# Patient Record
Sex: Female | Born: 1985 | Hispanic: Yes | Marital: Single | Smoking: Current every day smoker
Health system: Southern US, Community
[De-identification: ages and names within clinical notes are randomized; demographics above are authoritative.]

## PROBLEM LIST (undated history)

## (undated) DIAGNOSIS — F419 Anxiety disorder, unspecified: Secondary | ICD-10-CM

## (undated) DIAGNOSIS — O139 Gestational [pregnancy-induced] hypertension without significant proteinuria, unspecified trimester: Secondary | ICD-10-CM

## (undated) DIAGNOSIS — F329 Major depressive disorder, single episode, unspecified: Secondary | ICD-10-CM

## (undated) DIAGNOSIS — F32A Depression, unspecified: Secondary | ICD-10-CM

## (undated) HISTORY — PX: NO PAST SURGERIES: SHX2092

## (undated) HISTORY — DX: Gestational (pregnancy-induced) hypertension without significant proteinuria, unspecified trimester: O13.9

---
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
## 2011-04-30 DIAGNOSIS — O1490 Unspecified pre-eclampsia, unspecified trimester: Secondary | ICD-10-CM

## 2018-05-03 NOTE — L&D Delivery Note (Signed)
Patient: Julia Torres MRN: [id number]  GBS status: Unknown, IAP given: None due to term status   Patient is a 33 y.o. now G3P2 s/p NSVD at [redacted]w[redacted]d, who was admitted for IOL for gHTN. AROM 3h 62m prior to delivery with clear and bloody fluid.    Delivery Note At 4:52 AM a viable female was delivered via Vaginal, Spontaneous (Presentation: OA).  APGAR: 8, 9; weight pending.   Placenta status: spontaneous, intact.  Cord: 3 vessel with the following complications: short cord.   Anesthesia:  Epidural  Episiotomy: None Lacerations: 1st degree Suture Repair: 3.0 monocryl Est. Blood Loss (mL): 152  Head delivered OA. No nuchal cord present. Compound hand noted. Shoulder and body delivered in usual fashion. Infant with spontaneous cry, placed on mother's abdomen, dried and bulb suctioned. Cord clamped x 2 after 1-minute delay, and cut by family member. Cord blood drawn. Placenta delivered spontaneously with gentle cord traction. Fundus firm with massage and Pitocin. Perineum inspected and found to have small first degree laceration. Pressure was held but continued to bleed so was repaired with 3.0 monocryl with good hemostasis achieved.  Mom to postpartum.  Baby to Couplet care / Skin to Skin.  Melina Schools 12/10/2018, 5:15 AM

## 2018-06-19 ENCOUNTER — Encounter: Payer: Self-pay | Admitting: General Practice

## 2018-06-19 ENCOUNTER — Ambulatory Visit (INDEPENDENT_AMBULATORY_CARE_PROVIDER_SITE_OTHER): Payer: Medicaid Other | Admitting: *Deleted

## 2018-06-19 ENCOUNTER — Other Ambulatory Visit: Payer: Self-pay

## 2018-06-19 VITALS — BP 127/84 | HR 76 | Temp 98.1°F | Ht 69.0 in | Wt 224.6 lb

## 2018-06-19 DIAGNOSIS — Z3481 Encounter for supervision of other normal pregnancy, first trimester: Secondary | ICD-10-CM | POA: Diagnosis not present

## 2018-06-19 DIAGNOSIS — Z3480 Encounter for supervision of other normal pregnancy, unspecified trimester: Secondary | ICD-10-CM | POA: Diagnosis not present

## 2018-06-19 DIAGNOSIS — O09299 Supervision of pregnancy with other poor reproductive or obstetric history, unspecified trimester: Secondary | ICD-10-CM | POA: Diagnosis not present

## 2018-06-19 DIAGNOSIS — Z3201 Encounter for pregnancy test, result positive: Secondary | ICD-10-CM

## 2018-06-19 DIAGNOSIS — Z13228 Encounter for screening for other metabolic disorders: Secondary | ICD-10-CM | POA: Diagnosis not present

## 2018-06-19 DIAGNOSIS — Z3200 Encounter for pregnancy test, result unknown: Secondary | ICD-10-CM

## 2018-06-19 LAB — POCT URINALYSIS DIPSTICK OB
Blood, UA: NEGATIVE
Glucose, UA: NEGATIVE
Leukocytes, UA: NEGATIVE
Nitrite, UA: NEGATIVE
Spec Grav, UA: >=1.03 — AB (ref 1.010–1.025)
Urobilinogen, UA: 1 E.U./dL
pH, UA: 7.5 (ref 5.0–8.0)

## 2018-06-19 LAB — POCT URINE PREGNANCY: Preg Test, Ur: POSITIVE — AB

## 2018-06-19 NOTE — Progress Notes (Addendum)
   PRENATAL INTAKE SUMMARY  Ms. Collaso presents today New OB Nurse Interview.  OB History    Gravida  2   Para  1   Term  1   Preterm      AB      Living  1     SAB      TAB      Ectopic      Multiple      Live Births  1          I have reviewed the patient's medical, obstetrical, social, and family histories, medications, and available lab results.  SUBJECTIVE She has no unusual complaints. Marijuana use in pregnancy. Last use was three days ago.  History of preeclampsia during last pregnancy.  OBJECTIVE Initial nurse interview for history and lab work (New OB).  EDD: 12/23/2018 by LMP [id number] G2P1001 FHT: 155  GENERAL APPEARANCE: alert, well appearing, in no apparent distress, oriented to person, place and time.   ASSESSMENT Normal pregnancy  PLAN Prenatal care-[facility] Pnl/HIV  OB Urine Culture/Dip GC/CT/PAP at next visit with midwife HgbEval SMA/CF ([facility]) Panorama A1C If CHTN - P/C Ratio and CMP Ultrasound <14 weeks ordered Patient to start PNV Info on marijuana use during pregnancy and warning signs during pregnancy given  Genetic Screening Results Information: You are having genetic testing called [facility]ama today.  It will take approximately 2 weeks before the results are available.  To get your results, you need Internet access to a web browser to search [facility]/MyChart (the direct app on your phone will not give you these results).  Then select Lab Scanned and click on the blue hyper link that says View Image to see your Panorama results.  You can also use the directions on the purple card given to look up your results directly on the [facility] website.  Clovis Pu, RN

## 2018-06-19 NOTE — Patient Instructions (Addendum)
Genetic Screening Results Information: You are having genetic testing called Panorama today.  It will take approximately 2 weeks before the results are available.  To get your results, you need Internet access to a web browser to search [facility]/MyChart (the direct app on your phone will not give you these results).  Then select Lab Scanned and click on the blue hyper link that says View Image to see your Panorama results.  You can also use the directions on the purple card given to look up your results directly on the [facility] website.   Warning Signs During Pregnancy A pregnancy lasts about 40 weeks, starting from the first day of your last period until the baby is born. Pregnancy is divided into three phases called trimesters.  The first trimester refers to week 1 through week 13 of pregnancy.  The second trimester is the start of week 14 through the end of week 27.  The third trimester is the start of week 28 until you deliver your baby. During each trimester of pregnancy, certain signs and symptoms may indicate a problem. Talk with your health care provider about your current health and any medical conditions you have. Make sure you know the symptoms that you should watch for and report. How does this affect me?  Warning signs in the first trimester While some changes during the first trimester may be uncomfortable, most do not represent a serious problem. Let your health care provider know if you have any of the following warning signs in the first trimester:  You cannot eat or drink without vomiting, and this lasts for longer than a day.  You have vaginal bleeding or spotting along with menstrual-like cramping.  You have diarrhea for longer than a day.  You have a fever or other signs of infection, such as: ? Pain or burning when you urinate. ? Foul smelling or thick or yellowish vaginal discharge. Warning signs in the second trimester As your baby grows and changes during the  second trimester, there are additional signs and symptoms that may indicate a problem. These include:  Signs and symptoms of infection, including a fever.  Signs or symptoms of a miscarriage or preterm labor, such as regular contractions, menstrual-like cramping, or lower abdominal pain.  Bloody or watery vaginal discharge or obvious vaginal bleeding.  Feeling like your heart is pounding.  Having trouble breathing.  Nausea, vomiting, or diarrhea that lasts for longer than a day.  Craving non-food items, such as clay, chalk, or dirt. This may be a sign of a very treatable medical condition called pica. Later in your second trimester, watch for signs and symptoms of a serious medical condition called preeclampsia.These include:  Changes in your vision.  A severe headache that does not go away.  Nausea and vomiting. It is also important to notice if your baby stops moving or moves less than usual during this time. Warning signs in the third trimester As you approach the third trimester, your baby is growing and your body is preparing for the birth of your baby. In your third trimester, be sure to let your health care provider know if:  You have signs and symptoms of infection, including a fever.  You have vaginal bleeding.  You notice that your baby is moving less than usual or is not moving.  You have nausea, vomiting, or diarrhea that lasts for longer than a day.  You have a severe headache that does not go away.  You have vision changes,  including seeing spots or having blurry or double vision.  You have increased swelling in your hands or face. How does this affect my baby? Throughout your pregnancy, always report any of the warning signs of a problem to your health care provider. This can help prevent complications that may affect your baby, including:  Increased risk for premature birth.  Infection that may be transmitted to your baby.  Increased risk for  stillbirth. Contact a health care provider if:  You have any of the warning signs of a problem for the current trimester of your pregnancy.  Any of the following apply to you during any trimester of pregnancy: ? You have strong emotions, such as sadness or anxiety, that interfere with work or personal relationships. ? You feel unsafe in your home and need help finding a safe place to live. ? You are using tobacco products, alcohol, or drugs and you need help to stop. Get help right away if: You have signs or symptoms of labor before 37 weeks of pregnancy. These include:  Contractions that are 5 minutes or less apart, or that increase in frequency, intensity, or length.  Sudden, sharp abdominal pain or low back pain.  Uncontrolled gush or trickle of fluid from your vagina. Summary  A pregnancy lasts about 40 weeks, starting from the first day of your last period until the baby is born. Pregnancy is divided into three phases called trimesters. Each trimester has warning signs to watch for.  Always report any warning signs to your health care provider in order to prevent complications that may affect both you and your baby.  Talk with your health care provider about your current health and any medical conditions you have. Make sure you know the symptoms that you should watch for and report. This information is not intended to replace advice given to you by your health care provider. Make sure you discuss any questions you have with your health care provider. Document Released: 02/03/2017 Document Revised: 02/03/2017 Document Reviewed: 02/03/2017 Elsevier Interactive Patient Education  2019 [employer].      Marijuana Use During Pregnancy and Breastfeeding  Marijuana is the dried leaves, flowers, and stems of the Cannabis sativa or Cannabis indica plant. The plant's active ingredients (cannabinoids), including a chemical called THC, change the chemistry of the brain. Marijuana smoke  also has many of the same chemicals as cigarette smoke that cause breathing problems. Marijuana gets into your blood through your lungs when you smoke it and through your digestive system when you swallow it. Using marijuana in any form may be harmful for you and your baby when you are trying to become pregnant and during pregnancy. This includes marijuana that is prescribed to you by a health care provider (medical marijuana). Once marijuana is in your blood, it can travel through your placenta to your baby. It may also pass through breast milk. How does this affect me? Marijuana affects you both mentally and physically. Using marijuana can make you feel high and relaxed. It can also have negative effects, especially at high doses or with long-term use. These include:  Rapid heartbeat and stress on your heart.  Lung irritation and breathing problems.  Difficulty thinking and making decisions.  Seeing or believing things that are not true (hallucinations and paranoia).  Mood swings, depression, or anxiety.  Decreased ability to learn and remember.  Difficulty getting pregnant. Marijuana can also affect your pregnancy. Not all the effects are known. However, if you use marijuana during pregnancy,  you may:  Be less likely to get regular prenatal care and do the things that you need to do to have a healthy pregnancy.  Be more likely to use other drugs that can harm your pregnancy, like drinking alcohol and smoking cigarettes.  Be at higher risk of having your baby die after 28 weeks of pregnancy (stillbirth).  Be at higher risk of giving birth before 37 weeks of pregnancy (premature birth). How does this affect my baby? If you use marijuana during pregnancy, this may affect your baby's development, birth, and life after birth. Your baby may:  Be born prematurely, which can cause physical and mental problems.  Be born with a low birth weight, which can lead to physical and mental  problems.  Have problems with brain development.  Have difficulty growing.  Have attention and behavior problems later in life.  Do poorly at school and have learning problems later in life.  Have problems with vision and coordination.  Be at higher risk for using marijuana by age 54. More research is needed to find out exactly how marijuana affects a baby during breastfeeding. Some studies suggest that the chemicals in marijuana can be passed to a baby through breast milk. To limit possible risks, you should not use marijuana during breastfeeding. Follow these instructions at home:  Let your health care provider know if you use marijuana before trying to get pregnant, during pregnancy, or during breastfeeding.  Do not use marijuana in any form when you are trying to get pregnant, when you are pregnant, or when you are breastfeeding. If you are having trouble stopping marijuana use, ask your health care provider for help.  Do not smoke. If you need help quitting, ask your health care provider for help.  If you are using medical marijuana, ask your health care provider to switch you to a medicine that is safer to use during pregnancy or breastfeeding.  Keep all your prenatal visits as told by your health care provider. This is important. Where to find more information [employer] on Drug Abuse: www.drugabuse.gov March of Dimes: www.marchofdimes.org/pregnancy Contact a health care provider if:  You use marijuana and want to get pregnant.  You use marijuana during pregnancy or breastfeeding.  You need help stopping marijuana use. Get help right away if:  Your baby is not gaining weight or growing as expected. Summary  Using marijuana in any form may be harmful for you and your baby when you are trying to become pregnant, during pregnancy, and during breastfeeding. This includes marijuana that is prescribed to you (medical marijuana).  Some studies suggest that marijuana  may pass through breast milk and can affect your baby's brain development.  Talk to your health care provider if you use marijuana in any form while trying to get pregnant, during pregnancy, or while breastfeeding.  Ask your health care provider for help if you are not able to stop using marijuana. This information is not intended to replace advice given to you by your health care provider. Make sure you discuss any questions you have with your health care provider. Document Released: 01/05/2017 Document Revised: 01/05/2017 Document Reviewed: 01/05/2017 Elsevier Interactive Patient Education  2019 [employer].

## 2018-06-20 LAB — PROTEIN / CREATININE RATIO, URINE
Creatinine, Urine: 16.5 mg/dL
Protein, Ur: 4.8 mg/dL
Protein/Creat Ratio: 291 mg/g creat — ABNORMAL HIGH (ref 0–200)

## 2018-06-20 LAB — COMPREHENSIVE METABOLIC PANEL
ALT: 11 IU/L (ref 0–32)
AST: 8 IU/L (ref 0–40)
Albumin/Globulin Ratio: 1.5 (ref 1.2–2.2)
Albumin: 3.7 g/dL — ABNORMAL LOW (ref 3.9–5.0)
Alkaline Phosphatase: 67 IU/L (ref 39–117)
BUN/Creatinine Ratio: 11 (ref 9–23)
BUN: 6 mg/dL (ref 6–20)
Bilirubin Total: <0.2 mg/dL (ref 0.0–1.2)
CO2: 20 mmol/L (ref 20–29)
Calcium: 8.9 mg/dL (ref 8.7–10.2)
Chloride: 102 mmol/L (ref 96–106)
Creatinine, Ser: 0.54 mg/dL — ABNORMAL LOW (ref 0.57–1.00)
GFR calc Af Amer: 146 mL/min/{1.73_m2} (ref >59)
GFR calc non Af Amer: 127 mL/min/{1.73_m2} (ref >59)
Globulin, Total: 2.5 g/dL (ref 1.5–4.5)
Glucose: 124 mg/dL — ABNORMAL HIGH (ref 65–99)
Potassium: 3.9 mmol/L (ref 3.5–5.2)
Sodium: 137 mmol/L (ref 134–144)
Total Protein: 6.2 g/dL (ref 6.0–8.5)

## 2018-06-20 LAB — HEMOGLOBIN A1C
Est. average glucose Bld gHb Est-mCnc: 114 mg/dL
Hgb A1c MFr Bld: 5.6 % (ref 4.8–5.6)

## 2018-06-21 LAB — OBSTETRIC PANEL, INCLUDING HIV
Antibody Screen: NEGATIVE
Basophils Absolute: 0 10*3/uL (ref 0.0–0.2)
Basos: 0 %
EOS (ABSOLUTE): 0.1 10*3/uL (ref 0.0–0.4)
Eos: 1 %
HIV Screen 4th Generation wRfx: NONREACTIVE
Hematocrit: 39.9 % (ref 34.0–46.6)
Hemoglobin: 13.3 g/dL (ref 11.1–15.9)
Hepatitis B Surface Ag: NEGATIVE
IMMATURE GRANULOCYTES: 0 %
Immature Grans (Abs): 0 10*3/uL (ref 0.0–0.1)
Lymphocytes Absolute: 2 10*3/uL (ref 0.7–3.1)
Lymphs: 19 %
MCH: 28.7 pg (ref 26.6–33.0)
MCHC: 33.3 g/dL (ref 31.5–35.7)
MCV: 86 fL (ref 79–97)
Monocytes Absolute: 0.6 10*3/uL (ref 0.1–0.9)
Monocytes: 6 %
Neutrophils Absolute: 7.8 10*3/uL — ABNORMAL HIGH (ref 1.4–7.0)
Neutrophils: 74 %
Platelets: 306 10*3/uL (ref 150–450)
RBC: 4.63 x10E6/uL (ref 3.77–5.28)
RDW: 13.3 % (ref 11.7–15.4)
RPR: NONREACTIVE
Rh Factor: POSITIVE
Rubella Antibodies, IGG: <0.9 index — ABNORMAL LOW (ref >0.99)
WBC: 10.5 10*3/uL (ref 3.4–10.8)

## 2018-06-21 LAB — HEMOGLOBINOPATHY EVALUATION
Ferritin: 35 ng/mL (ref 15–150)
HGB F QUANT: 0 % (ref 0.0–2.0)
Hgb A2 Quant: 2 % (ref 1.8–3.2)
Hgb A: 98 % (ref 96.4–98.8)
Hgb C: 0 %
Hgb S: 0 %
Hgb Solubility: NEGATIVE
Hgb Variant: 0 %

## 2018-06-21 LAB — CULTURE, OB URINE

## 2018-06-21 LAB — URINE CULTURE, OB REFLEX: ORGANISM ID, BACTERIA: NO GROWTH

## 2018-06-22 ENCOUNTER — Telehealth: Payer: Self-pay | Admitting: *Deleted

## 2018-06-22 DIAGNOSIS — O09299 Supervision of pregnancy with other poor reproductive or obstetric history, unspecified trimester: Secondary | ICD-10-CM

## 2018-06-22 MED ORDER — ASPIRIN 81 MG PO TABS: 81.0000 mg | ORAL_TABLET | Freq: Every day | ORAL | 6 refills | Status: DC

## 2018-06-22 NOTE — Telephone Encounter (Signed)
Spoke with patient regarding preeclampsia labs. Labs were abnormal. Patient to start Aspirin 81 mg daily until [redacted] weeks gestation.  Medication sent to pharmacy.  Clovis Pu, RN

## 2018-06-22 NOTE — Telephone Encounter (Signed)
-----   Message from [place], [place] sent at 06/21/2018  3:24 PM EST ----- This patient needs to be on baby asprin 81 mg daily until 36 wks

## 2018-06-26 ENCOUNTER — Encounter: Payer: Self-pay | Admitting: General Practice

## 2018-06-28 ENCOUNTER — Ambulatory Visit (HOSPITAL_COMMUNITY)
Admission: RE | Admit: 2018-06-28 | Discharge: 2018-06-28 | Disposition: A | Discharge status: Home / Self Care | Payer: Medicaid Other | Source: Ambulatory Visit | Attending: Obstetrics and Gynecology | Admitting: Obstetrics and Gynecology

## 2018-06-28 ENCOUNTER — Encounter: Payer: Self-pay | Admitting: General Practice

## 2018-06-28 ENCOUNTER — Other Ambulatory Visit: Payer: Self-pay | Admitting: Obstetrics and Gynecology

## 2018-06-28 DIAGNOSIS — Z3480 Encounter for supervision of other normal pregnancy, unspecified trimester: Secondary | ICD-10-CM | POA: Insufficient documentation

## 2018-06-28 DIAGNOSIS — Z3A13 13 weeks gestation of pregnancy: Secondary | ICD-10-CM | POA: Diagnosis not present

## 2018-06-28 DIAGNOSIS — O26841 Uterine size-date discrepancy, first trimester: Secondary | ICD-10-CM | POA: Diagnosis not present

## 2018-07-05 ENCOUNTER — Encounter: Payer: Medicaid Other | Admitting: Obstetrics and Gynecology

## 2018-07-17 ENCOUNTER — Telehealth: Payer: Self-pay | Admitting: *Deleted

## 2018-07-17 NOTE — Telephone Encounter (Signed)
Patient informed that as part of COVID-19 precautions our office are only allowing patients to bring 1 support person to their visit.  Clovis Pu, RN

## 2018-07-19 ENCOUNTER — Other Ambulatory Visit: Payer: Self-pay | Admitting: Obstetrics and Gynecology

## 2018-07-19 ENCOUNTER — Other Ambulatory Visit (HOSPITAL_COMMUNITY)
Admission: RE | Admit: 2018-07-19 | Discharge: 2018-07-19 | Disposition: A | Discharge status: Home / Self Care | Payer: Medicaid Other | Source: Ambulatory Visit | Attending: Obstetrics and Gynecology | Admitting: Obstetrics and Gynecology

## 2018-07-19 ENCOUNTER — Encounter: Payer: Self-pay | Admitting: Obstetrics and Gynecology

## 2018-07-19 ENCOUNTER — Telehealth: Payer: Self-pay | Admitting: General Practice

## 2018-07-19 ENCOUNTER — Encounter: Payer: Self-pay | Admitting: General Practice

## 2018-07-19 ENCOUNTER — Telehealth: Payer: Self-pay | Admitting: *Deleted

## 2018-07-19 ENCOUNTER — Ambulatory Visit (INDEPENDENT_AMBULATORY_CARE_PROVIDER_SITE_OTHER): Payer: Medicaid Other | Admitting: Obstetrics and Gynecology

## 2018-07-19 ENCOUNTER — Other Ambulatory Visit: Payer: Self-pay

## 2018-07-19 VITALS — BP 135/83 | HR 79 | Temp 98.6°F | Wt 231.0 lb

## 2018-07-19 DIAGNOSIS — Z3A17 17 weeks gestation of pregnancy: Secondary | ICD-10-CM

## 2018-07-19 DIAGNOSIS — Z3480 Encounter for supervision of other normal pregnancy, unspecified trimester: Secondary | ICD-10-CM

## 2018-07-19 DIAGNOSIS — O09299 Supervision of pregnancy with other poor reproductive or obstetric history, unspecified trimester: Secondary | ICD-10-CM

## 2018-07-19 DIAGNOSIS — O285 Abnormal chromosomal and genetic finding on antenatal screening of mother: Secondary | ICD-10-CM

## 2018-07-19 NOTE — Progress Notes (Signed)
  Subjective:    Julia Torres is being seen today for her first obstetrical visit.  This is a planned pregnancy. She is at [redacted]w[redacted]d gestation. Her obstetrical history is significant for history of pre-eclampsia and smoker. Relationship with FOB: significant other, not living together. Patient does intend to breast feed. Pregnancy history fully reviewed.  Patient reports occasional vomiting x 2 days, but then resolves.  Review of Systems:   Review of Systems  Constitutional: Negative.   HENT: Negative.   Eyes: Negative.   Respiratory: Negative.   Cardiovascular: Negative.   Gastrointestinal: Negative.   Endocrine: Negative.   Genitourinary: Negative.   Musculoskeletal: Negative.   Skin: Negative.   Allergic/Immunologic: Negative.   Neurological: Negative.   Hematological: Negative.   Psychiatric/Behavioral: Negative.     Objective:     BP 135/83   Pulse 79   Temp 98.6 F (37 C)   Wt 231 lb (104.8 kg)   LMP 03/18/2018 (Approximate)   BMI 34.11 kg/m  Physical Exam  Nursing note and vitals reviewed. Constitutional: She is oriented to person, place, and time. She appears well-developed and well-nourished.  HENT:  Head: Normocephalic and atraumatic.  Eyes: Pupils are equal, round, and reactive to light. Left eye discharge:    Neck: Normal range of motion. Neck supple.  Cardiovascular: Normal rate, normal heart sounds and intact distal pulses.  Respiratory: Effort normal and breath sounds normal.  GI: Soft. Bowel sounds are normal.  Genitourinary:    Vulva normal.     Genitourinary Comments: Uterus: GRAVID, S=D, SE: cervix is smooth, pink, no lesions, small amt of thin, white vaginal d/c -- PAP, WP, GC/CT done, closed/long/firm, no CMT or friability, no adnexal tenderness    Musculoskeletal: Normal range of motion.  Neurological: She is alert and oriented to person, place, and time. She has normal reflexes.  Skin: Skin is warm and dry.  Psychiatric: She has a normal mood  and affect. Her behavior is normal. Judgment and thought content normal.    Maternal Exam:  Abdomen: Patient reports no abdominal tenderness. Fundal height is 17 cm.    Introitus: Normal vulva. Normal vagina.  Ferning test: not done.  Nitrazine test: not done. Amniotic fluid character: not assessed.  Pelvis: adequate for delivery.   Cervix: Cervix evaluated by sterile speculum exam and digital exam.     Fetal Exam Fetal Monitor Review: Baseline rate: 153 bpm.         Assessment:    Pregnancy: G2P1001 Patient Active Problem List   Diagnosis Date Noted  . Supervision of other normal pregnancy, antepartum 06/19/2018  . Hx of preeclampsia, prior pregnancy, currently pregnant 06/19/2018       Plan:     Initial labs reviewed. Prenatal vitamins. Problem list reviewed and updated. AFP3 discussed: ordered. Not drawn today. Referral to genetic counselor for atypical Panorama findings. Role of ultrasound in pregnancy discussed; fetal survey: ordered. Amniocentesis discussed: referred to genetic counselor to decide on. The nature of [facility] with multiple MDs and other Advanced Practice Providers was explained to patient; also emphasized that residents, students are part of our team. Follow up in 4 weeks. 50% of 40 min visit spent on counseling and coordination of care.    Raelyn Mora, CNM 07/19/2018

## 2018-07-19 NOTE — Patient Instructions (Signed)

## 2018-07-19 NOTE — Telephone Encounter (Signed)
Tried to call patient regarding genetic counseling and need for AFP lab work. Unable to leave voice message; voicemail was not set up.  Clovis Pu, RN

## 2018-07-19 NOTE — Telephone Encounter (Signed)
Unable to reach patient to inform her of [place] appt has been rescheduled b/c pt will need Genetic Counseling as well.  Therefore, [place] had to be rescheduled.  Unable to leave message on VM.

## 2018-07-19 NOTE — Telephone Encounter (Signed)
-----   Message from Raelyn Mora, [place] sent at 07/19/2018  1:03 PM EDT ----- Regarding: Please call about AFP & genetic counseling appt Please don't forget to call her about this.  Thanks, Raelyn Mora, CNM

## 2018-07-20 NOTE — Telephone Encounter (Signed)
Telephone call to patient, unable to leave voice message. Voicemail not set up. Patient need AFP drawn and genetic counseling.  Clovis Pu, RN

## 2018-07-21 ENCOUNTER — Telehealth: Payer: Self-pay | Admitting: General Practice

## 2018-07-21 LAB — CYTOLOGY - PAP
Diagnosis: NEGATIVE
HPV: NOT DETECTED

## 2018-07-21 NOTE — Telephone Encounter (Signed)
Unable to reach pt via phone.  Called and spoke with Ace Stapleton (significant other) and asked him to have pt to give our office a call back.

## 2018-07-22 LAB — CERVICOVAGINAL ANCILLARY ONLY
Bacterial vaginitis: POSITIVE — AB
Candida vaginitis: NEGATIVE
Chlamydia: NEGATIVE
Neisseria Gonorrhea: NEGATIVE
Trichomonas: NEGATIVE

## 2018-07-24 ENCOUNTER — Encounter: Payer: Self-pay | Admitting: *Deleted

## 2018-07-24 ENCOUNTER — Encounter (HOSPITAL_COMMUNITY): Payer: Self-pay

## 2018-07-24 ENCOUNTER — Telehealth: Payer: Self-pay | Admitting: *Deleted

## 2018-07-24 DIAGNOSIS — B9689 Other specified bacterial agents as the cause of diseases classified elsewhere: Secondary | ICD-10-CM

## 2018-07-24 DIAGNOSIS — O23599 Infection of other part of genital tract in pregnancy, unspecified trimester: Principal | ICD-10-CM

## 2018-07-24 MED ORDER — METRONIDAZOLE 500 MG PO TABS: 500.0000 mg | ORAL_TABLET | Freq: Two times a day (BID) | ORAL | 0 refills | Status: DC

## 2018-07-24 NOTE — Telephone Encounter (Signed)
Unable to leave voice message. Voice mail not set up. Medication sent to pharmacy for BV treatment.  Clovis Pu, RN

## 2018-07-24 NOTE — Progress Notes (Signed)
Letter mail to patient for normal PAP test.  Clovis Pu, RN

## 2018-07-24 NOTE — Telephone Encounter (Signed)
-----   Message from Raelyn Mora, [place] sent at 07/24/2018 11:01 AM EDT ----- Please treat for BV

## 2018-07-28 ENCOUNTER — Ambulatory Visit (HOSPITAL_COMMUNITY): Payer: Medicaid Other

## 2018-07-28 ENCOUNTER — Other Ambulatory Visit (HOSPITAL_COMMUNITY): Payer: Medicaid Other

## 2018-07-31 ENCOUNTER — Ambulatory Visit (HOSPITAL_COMMUNITY): Payer: Medicaid Other | Admitting: *Deleted

## 2018-07-31 ENCOUNTER — Other Ambulatory Visit (HOSPITAL_COMMUNITY): Payer: Self-pay | Admitting: *Deleted

## 2018-07-31 ENCOUNTER — Ambulatory Visit (HOSPITAL_COMMUNITY): Payer: Self-pay | Admitting: Obstetrics and Gynecology

## 2018-07-31 ENCOUNTER — Ambulatory Visit (HOSPITAL_BASED_OUTPATIENT_CLINIC_OR_DEPARTMENT_OTHER)
Admission: RE | Admit: 2018-07-31 | Discharge: 2018-07-31 | Disposition: A | Discharge status: Home / Self Care | Payer: Medicaid Other | Source: Ambulatory Visit | Attending: Maternal & Fetal Medicine | Admitting: Maternal & Fetal Medicine

## 2018-07-31 ENCOUNTER — Other Ambulatory Visit (HOSPITAL_COMMUNITY): Payer: Self-pay | Admitting: Maternal & Fetal Medicine

## 2018-07-31 ENCOUNTER — Ambulatory Visit (HOSPITAL_COMMUNITY): Payer: Medicaid Other

## 2018-07-31 ENCOUNTER — Other Ambulatory Visit: Payer: Self-pay

## 2018-07-31 ENCOUNTER — Encounter (HOSPITAL_COMMUNITY): Payer: Self-pay

## 2018-07-31 ENCOUNTER — Ambulatory Visit (HOSPITAL_COMMUNITY)
Admission: RE | Admit: 2018-07-31 | Discharge: 2018-07-31 | Disposition: A | Discharge status: Home / Self Care | Payer: Medicaid Other | Source: Ambulatory Visit | Attending: Obstetrics and Gynecology | Admitting: Obstetrics and Gynecology

## 2018-07-31 VITALS — BP 136/80 | HR 87 | Temp 98.7°F

## 2018-07-31 DIAGNOSIS — Z362 Encounter for other antenatal screening follow-up: Secondary | ICD-10-CM

## 2018-07-31 DIAGNOSIS — O0990 Supervision of high risk pregnancy, unspecified, unspecified trimester: Secondary | ICD-10-CM

## 2018-07-31 DIAGNOSIS — O99212 Obesity complicating pregnancy, second trimester: Secondary | ICD-10-CM

## 2018-07-31 DIAGNOSIS — O99332 Smoking (tobacco) complicating pregnancy, second trimester: Secondary | ICD-10-CM

## 2018-07-31 DIAGNOSIS — O09292 Supervision of pregnancy with other poor reproductive or obstetric history, second trimester: Secondary | ICD-10-CM | POA: Insufficient documentation

## 2018-07-31 DIAGNOSIS — Z363 Encounter for antenatal screening for malformations: Secondary | ICD-10-CM

## 2018-07-31 DIAGNOSIS — O280 Abnormal hematological finding on antenatal screening of mother: Secondary | ICD-10-CM

## 2018-07-31 DIAGNOSIS — O281 Abnormal biochemical finding on antenatal screening of mother: Secondary | ICD-10-CM | POA: Diagnosis not present

## 2018-07-31 DIAGNOSIS — Z3A18 18 weeks gestation of pregnancy: Secondary | ICD-10-CM

## 2018-07-31 DIAGNOSIS — Z3480 Encounter for supervision of other normal pregnancy, unspecified trimester: Secondary | ICD-10-CM

## 2018-07-31 DIAGNOSIS — O289 Unspecified abnormal findings on antenatal screening of mother: Secondary | ICD-10-CM | POA: Diagnosis not present

## 2018-07-31 DIAGNOSIS — O09299 Supervision of pregnancy with other poor reproductive or obstetric history, unspecified trimester: Secondary | ICD-10-CM

## 2018-07-31 NOTE — Progress Notes (Signed)
Patient in session with Dentist, [name].

## 2018-08-01 ENCOUNTER — Telehealth (HOSPITAL_COMMUNITY): Payer: Self-pay | Admitting: *Deleted

## 2018-08-01 NOTE — Telephone Encounter (Signed)
Call to patient following amniocentesis on 3/30.  Pt doing well, no complaints.

## 2018-08-07 ENCOUNTER — Telehealth: Payer: Self-pay | Admitting: *Deleted

## 2018-08-07 LAB — CHROMOSOME, BLOOD, ROUTINE
Cells Analyzed: 20
Cells Counted: 20
Cells Karyotyped: 2
GTG BAND RESOLUTION ACHIEVED: 500

## 2018-08-07 NOTE — Telephone Encounter (Signed)
Left voice message for patient that [facility] appt will be telehealth/webex visit. Will send Mychart message.  Clovis Pu, RN

## 2018-08-10 ENCOUNTER — Ambulatory Visit (HOSPITAL_COMMUNITY): Payer: Self-pay | Admitting: Obstetrics and Gynecology

## 2018-08-11 LAB — MATERNAL CELL CONTAMINATION

## 2018-08-11 LAB — CHROM. 5 COUNT +  REVEAL SNP MICROARRAY
Cells Analyzed: 5
Cells Counted: 5
Cells Karyotyped: 2
Colonies: 5
GTG Band Resolution Achieved: 450

## 2018-08-11 LAB — AFP, AMNIOTIC FLUID
AFP AMN (MCG/ML): 7.8 ug/mL
Gestational Age(Wks): 18
MOM, Amniotic Fluid: 0.88

## 2018-08-15 ENCOUNTER — Telehealth (HOSPITAL_COMMUNITY): Payer: Self-pay | Admitting: *Deleted

## 2018-08-15 NOTE — Patient Instructions (Signed)
Glucose Tolerance Test During Pregnancy Why am I having this test? The glucose tolerance test (GTT) is done to check how your body processes sugar (glucose). This is one of several tests used to diagnose diabetes that develops during pregnancy (gestational diabetes mellitus). Gestational diabetes is a temporary form of diabetes that some women develop during pregnancy. It usually occurs during the second trimester of pregnancy and goes away after delivery. Testing (screening) for gestational diabetes usually occurs between 24 and 28 weeks of pregnancy. You may have the GTT test after having a 1-hour glucose screening test if the results from that test indicate that you may have gestational diabetes. You may also have this test if:  You have a history of gestational diabetes.  You have a history of giving birth to very large babies or have experienced repeated fetal loss (stillbirth).  You have signs and symptoms of diabetes, such as: ? Changes in your vision. ? Tingling or numbness in your hands or feet. ? Changes in hunger, thirst, and urination that are not otherwise explained by your pregnancy. What is being tested? This test measures the amount of glucose in your blood at different times during a period of 3 hours. This indicates how well your body is able to process glucose. What kind of sample is taken?  Blood samples are required for this test. They are usually collected by inserting a needle into a blood vessel. How do I prepare for this test?  For 3 days before your test, eat normally. Have plenty of carbohydrate-rich foods.  Follow instructions from your health care provider about: ? Eating or drinking restrictions on the day of the test. You may be asked to not eat or drink anything other than water (fast) starting 8-10 hours before the test. ? Changing or stopping your regular medicines. Some medicines may interfere with this test. Tell a health care provider about:  All  medicines you are taking, including vitamins, herbs, eye drops, creams, and over-the-counter medicines.  Any blood disorders you have.  Any surgeries you have had.  Any medical conditions you have. What happens during the test? First, your blood glucose will be measured. This is referred to as your fasting blood glucose, since you fasted before the test. Then, you will drink a glucose solution that contains a certain amount of glucose. Your blood glucose will be measured again 1, 2, and 3 hours after drinking the solution. This test takes about 3 hours to complete. You will need to stay at the testing location during this time. During the testing period:  Do not eat or drink anything other than the glucose solution.  Do not exercise.  Do not use any products that contain nicotine or tobacco, such as cigarettes and e-cigarettes. If you need help stopping, ask your health care provider. The testing procedure may vary among health care providers and hospitals. How are the results reported? Your results will be reported as milligrams of glucose per deciliter of blood (mg/dL) or millimoles per liter (mmol/L). Your health care provider will compare your results to normal ranges that were established after testing a large group of people (reference ranges). Reference ranges may vary among labs and hospitals. For this test, common reference ranges are:  Fasting: less than 95-105 mg/dL (5.3-5.8 mmol/L).  1 hour after drinking glucose: less than 180-190 mg/dL (10.0-10.5 mmol/L).  2 hours after drinking glucose: less than 155-165 mg/dL (8.6-9.2 mmol/L).  3 hours after drinking glucose: 140-145 mg/dL (7.8-8.1 mmol/L). What do the   results mean? Results within reference ranges are considered normal, meaning that your glucose levels are well-controlled. If two or more of your blood glucose levels are high, you may be diagnosed with gestational diabetes. If only one level is high, your health care  provider may suggest repeat testing or other tests to confirm a diagnosis. Talk with your health care provider about what your results mean. Questions to ask your health care provider Ask your health care provider, or the department that is doing the test:  When will my results be ready?  How will I get my results?  What are my treatment options?  What other tests do I need?  What are my next steps? Summary  The glucose tolerance test (GTT) is one of several tests used to diagnose diabetes that develops during pregnancy (gestational diabetes mellitus). Gestational diabetes is a temporary form of diabetes that some women develop during pregnancy.  You may have the GTT test after having a 1-hour glucose screening test if the results from that test indicate that you may have gestational diabetes. You may also have this test if you have any symptoms or risk factors for gestational diabetes.  Talk with your health care provider about what your results mean. This information is not intended to replace advice given to you by your health care provider. Make sure you discuss any questions you have with your health care provider. Document Released: 10/19/2011 Document Revised: 11/29/2016 Document Reviewed: 11/29/2016 Elsevier Interactive Patient Education  2019 Elsevier Inc.  

## 2018-08-15 NOTE — Telephone Encounter (Signed)
Calling with results from amnio and blood work.  Requested patient to return call.

## 2018-08-16 ENCOUNTER — Encounter: Payer: Self-pay | Admitting: Obstetrics and Gynecology

## 2018-08-16 ENCOUNTER — Ambulatory Visit (INDEPENDENT_AMBULATORY_CARE_PROVIDER_SITE_OTHER): Payer: Medicaid Other | Admitting: Obstetrics and Gynecology

## 2018-08-16 ENCOUNTER — Other Ambulatory Visit: Payer: Self-pay

## 2018-08-16 DIAGNOSIS — O09292 Supervision of pregnancy with other poor reproductive or obstetric history, second trimester: Secondary | ICD-10-CM | POA: Diagnosis not present

## 2018-08-16 DIAGNOSIS — Z3A20 20 weeks gestation of pregnancy: Secondary | ICD-10-CM

## 2018-08-16 DIAGNOSIS — O09299 Supervision of pregnancy with other poor reproductive or obstetric history, unspecified trimester: Secondary | ICD-10-CM

## 2018-08-16 DIAGNOSIS — O285 Abnormal chromosomal and genetic finding on antenatal screening of mother: Secondary | ICD-10-CM

## 2018-08-16 NOTE — Progress Notes (Signed)
TELEHEALTH VIRTUAL OBSTETRICS VISIT ENCOUNTER NOTE  I connected with Julia MainlandSarah Pollino on 08/16/18 at  9:30 AM EDT by telephone at home and verified that I am speaking with the correct person using two identifiers.   I discussed the limitations, risks, security and privacy concerns of performing an evaluation and management service by telephone and the availability of in person appointments. I also discussed with the patient that there may be a patient responsible charge related to this service. The patient expressed understanding and agreed to proceed.  Subjective:  Julia Torres is a 33 y.o. G2P1001 at 2013w5d being followed for ongoing prenatal care.  She is currently monitored for the following issues for this low-risk pregnancy and has Supervision of other normal pregnancy, antepartum and Hx of preeclampsia, prior pregnancy, currently pregnant on their problem list.  Patient reports swelling in lower extremities that dents in when she presses it. She reports that the amniocentesis she had was painful. "I was down for the rest of the day, but feeling much better now." Reports (+) fetal movement. Denies any contractions, bleeding or leaking of fluid.   The following portions of the patient's history were reviewed and updated as appropriate: allergies, current medications, past family history, past medical history, past social history, past surgical history and problem list.   Objective:   General:  Alert, oriented and cooperative.   Mental Status: Normal mood and affect perceived. Normal judgment and thought content.  Rest of physical exam deferred due to type of encounter  Assessment and Plan:  Pregnancy: G2P1001 at 5413w5d 1. Hx of preeclampsia, prior pregnancy, currently pregnant - Taking bASA daily  2. Abnormal chromosomal and genetic finding on antenatal screening mother - Discussed results from genetic counseling, amniocentesis and blood chromosomes  normal female - Anticipatory  guidance for 2 hr GTT nv - No access to BP cuff or scale at this time - Advised to please purchase electric Blood Pressure Cuff from [facility]. These cuffs have been tested to be very comparable to the ones we use in our offices. The purchase price should be <$25.00. Please let our office know, if you absolutely cannot afford to purchase a cuff or borrow one from someone. We will then make arrangements for you to get a cuff. We have a limited supply of cuffs for those that have no other resources to obtain a cuff. We encourage you to get registered in [employer] so you can enter your blood pressures in that system. If you have any problems getting in [employer], please call the office for help.  will resend [employer] invite to corrected email address.   Preterm labor symptoms and general obstetric precautions including but not limited to vaginal bleeding, contractions, leaking of fluid and fetal movement were reviewed in detail with the patient.  I discussed the assessment and treatment plan with the patient. The patient was provided an opportunity to ask questions and all were answered. The patient agreed with the plan and demonstrated an understanding of the instructions. The patient was advised to call back or seek an in-person office evaluation/[facility] for any urgent or concerning symptoms. Please refer to After Visit Summary for other counseling recommendations.   I provided 10 minutes of non-face-to-face time during this encounter.  Return in about 2 months (around 10/16/2018) for Return OB 2hr GTT.  Future Appointments  Date Time Provider Department Center  08/28/2018  3:00 PM [facility] NURSE [facility]  08/28/2018  3:00 PM [facility] [place]  3 [facility]-US  10/11/2018  8:10 AM Raelyn Mora, CNM [facility] None    Raelyn Mora, CNM Center for [employer], [facility]

## 2018-08-28 ENCOUNTER — Encounter (HOSPITAL_COMMUNITY): Payer: Self-pay

## 2018-08-28 ENCOUNTER — Ambulatory Visit (HOSPITAL_COMMUNITY)
Admission: RE | Admit: 2018-08-28 | Discharge: 2018-08-28 | Disposition: A | Discharge status: Home / Self Care | Payer: Medicaid Other | Source: Ambulatory Visit | Attending: Obstetrics and Gynecology | Admitting: Obstetrics and Gynecology

## 2018-08-28 ENCOUNTER — Ambulatory Visit (HOSPITAL_COMMUNITY): Payer: Medicaid Other | Admitting: *Deleted

## 2018-08-28 ENCOUNTER — Other Ambulatory Visit: Payer: Self-pay

## 2018-08-28 VITALS — BP 123/81 | HR 77 | Temp 98.9°F

## 2018-08-28 DIAGNOSIS — O09299 Supervision of pregnancy with other poor reproductive or obstetric history, unspecified trimester: Secondary | ICD-10-CM | POA: Diagnosis not present

## 2018-08-28 DIAGNOSIS — Z362 Encounter for other antenatal screening follow-up: Secondary | ICD-10-CM | POA: Diagnosis not present

## 2018-08-28 DIAGNOSIS — Z8759 Personal history of other complications of pregnancy, childbirth and the puerperium: Secondary | ICD-10-CM

## 2018-08-28 DIAGNOSIS — Z3480 Encounter for supervision of other normal pregnancy, unspecified trimester: Secondary | ICD-10-CM

## 2018-08-29 ENCOUNTER — Other Ambulatory Visit (HOSPITAL_COMMUNITY): Payer: Self-pay | Admitting: *Deleted

## 2018-08-29 DIAGNOSIS — O99210 Obesity complicating pregnancy, unspecified trimester: Secondary | ICD-10-CM

## 2018-09-06 ENCOUNTER — Telehealth: Payer: Self-pay | Admitting: *Deleted

## 2018-09-06 DIAGNOSIS — B3731 Acute candidiasis of vulva and vagina: Secondary | ICD-10-CM

## 2018-09-06 DIAGNOSIS — B373 Candidiasis of vulva and vagina: Secondary | ICD-10-CM

## 2018-09-06 MED ORDER — TERCONAZOLE 0.4 % VA CREA: 1.0000 | TOPICAL_CREAM | Freq: Every day | VAGINAL | 0 refills | Status: DC

## 2018-09-06 NOTE — Telephone Encounter (Signed)
Patient called stating she was have cheesy discharge with itching. Offered patient to come in for self swab, but declined. Terazol cream sent to pharmacy.  Clovis Pu, RN

## 2018-09-07 MED ORDER — TERCONAZOLE 0.4 % VA CREA: 1.0000 | TOPICAL_CREAM | Freq: Every day | VAGINAL | 0 refills | Status: DC

## 2018-09-07 NOTE — Addendum Note (Signed)
Addended by: Clovis Pu on: 09/07/2018 08:02 AM   Modules accepted: Orders

## 2018-09-20 ENCOUNTER — Encounter: Payer: Self-pay | Admitting: *Deleted

## 2018-09-26 ENCOUNTER — Ambulatory Visit (HOSPITAL_COMMUNITY): Payer: Medicaid Other | Admitting: *Deleted

## 2018-09-26 ENCOUNTER — Encounter (HOSPITAL_COMMUNITY): Payer: Self-pay

## 2018-09-26 ENCOUNTER — Other Ambulatory Visit: Payer: Self-pay

## 2018-09-26 ENCOUNTER — Ambulatory Visit (HOSPITAL_COMMUNITY)
Admission: RE | Admit: 2018-09-26 | Discharge: 2018-09-26 | Disposition: A | Discharge status: Home / Self Care | Payer: Medicaid Other | Source: Ambulatory Visit | Attending: Obstetrics and Gynecology | Admitting: Obstetrics and Gynecology

## 2018-09-26 VITALS — BP 128/79 | HR 105 | Temp 99.0°F

## 2018-09-26 DIAGNOSIS — Z3A26 26 weeks gestation of pregnancy: Secondary | ICD-10-CM | POA: Diagnosis not present

## 2018-09-26 DIAGNOSIS — Z8759 Personal history of other complications of pregnancy, childbirth and the puerperium: Secondary | ICD-10-CM

## 2018-09-26 DIAGNOSIS — O09292 Supervision of pregnancy with other poor reproductive or obstetric history, second trimester: Secondary | ICD-10-CM

## 2018-09-26 DIAGNOSIS — O99212 Obesity complicating pregnancy, second trimester: Secondary | ICD-10-CM

## 2018-09-26 DIAGNOSIS — O99210 Obesity complicating pregnancy, unspecified trimester: Secondary | ICD-10-CM | POA: Diagnosis not present

## 2018-09-26 DIAGNOSIS — Z3480 Encounter for supervision of other normal pregnancy, unspecified trimester: Secondary | ICD-10-CM | POA: Insufficient documentation

## 2018-09-26 DIAGNOSIS — O09299 Supervision of pregnancy with other poor reproductive or obstetric history, unspecified trimester: Secondary | ICD-10-CM | POA: Diagnosis not present

## 2018-09-26 DIAGNOSIS — O289 Unspecified abnormal findings on antenatal screening of mother: Secondary | ICD-10-CM | POA: Diagnosis not present

## 2018-09-26 DIAGNOSIS — O99332 Smoking (tobacco) complicating pregnancy, second trimester: Secondary | ICD-10-CM | POA: Diagnosis not present

## 2018-09-26 DIAGNOSIS — Z362 Encounter for other antenatal screening follow-up: Secondary | ICD-10-CM | POA: Diagnosis not present

## 2018-09-27 ENCOUNTER — Other Ambulatory Visit (HOSPITAL_COMMUNITY): Payer: Self-pay | Admitting: *Deleted

## 2018-09-27 DIAGNOSIS — Z362 Encounter for other antenatal screening follow-up: Secondary | ICD-10-CM

## 2018-10-03 ENCOUNTER — Other Ambulatory Visit: Payer: Self-pay | Admitting: Obstetrics and Gynecology

## 2018-10-03 ENCOUNTER — Telehealth: Payer: Self-pay | Admitting: Obstetrics and Gynecology

## 2018-10-03 DIAGNOSIS — F419 Anxiety disorder, unspecified: Secondary | ICD-10-CM

## 2018-10-03 DIAGNOSIS — O99342 Other mental disorders complicating pregnancy, second trimester: Secondary | ICD-10-CM

## 2018-10-03 NOTE — Telephone Encounter (Signed)
LVM to return call to [phone].  Raelyn Mora, CNM

## 2018-10-03 NOTE — Progress Notes (Addendum)
TC from pt stating she is very anxious after reading her U/S results that have "not well" results by some of the fetal anatomy. She states "the doctor tells me everything is ok, but when reading it on MyChart I'm reading what I think are abnormal results."  Reviewed F/U U/S results with patient and explained that the fetal anatomy that she is concerned about reads "not well visualized" -- meaning that the fetus may not have been laying where they could get the best views of the anatomy and they will re-evaluate it at her F/U U/S on 10/24/2018.  Patient reports that she stopped smoking THC "a few weeks ago" and now she recognizes that her anxiety has increased. She used THC as a coping mechanism and is having a hard time coping. Offered ambulatory referral for integrated behavioral appt -- pt agrees. Order placed.  Raelyn Mora, CNM  10/03/2018 11:17 AM

## 2018-10-11 ENCOUNTER — Encounter: Payer: Medicaid Other | Admitting: Obstetrics and Gynecology

## 2018-10-19 ENCOUNTER — Encounter: Payer: Self-pay | Admitting: General Practice

## 2018-10-19 ENCOUNTER — Encounter: Payer: Self-pay | Admitting: Obstetrics and Gynecology

## 2018-10-19 ENCOUNTER — Other Ambulatory Visit: Payer: Self-pay

## 2018-10-19 ENCOUNTER — Ambulatory Visit (INDEPENDENT_AMBULATORY_CARE_PROVIDER_SITE_OTHER): Payer: Medicaid Other | Admitting: Obstetrics and Gynecology

## 2018-10-19 VITALS — BP 130/81 | HR 79 | Temp 97.5°F | Wt 228.0 lb

## 2018-10-19 DIAGNOSIS — Z23 Encounter for immunization: Secondary | ICD-10-CM | POA: Diagnosis not present

## 2018-10-19 DIAGNOSIS — Z3480 Encounter for supervision of other normal pregnancy, unspecified trimester: Secondary | ICD-10-CM

## 2018-10-19 DIAGNOSIS — F419 Anxiety disorder, unspecified: Secondary | ICD-10-CM

## 2018-10-19 DIAGNOSIS — Z3A29 29 weeks gestation of pregnancy: Secondary | ICD-10-CM

## 2018-10-19 DIAGNOSIS — O99343 Other mental disorders complicating pregnancy, third trimester: Secondary | ICD-10-CM

## 2018-10-19 NOTE — Progress Notes (Signed)
   PRENATAL VISIT NOTE  Subjective:  Julia Torres is a 33 y.o. G2P1001 at [redacted]w[redacted]d being seen today for ongoing prenatal care.  She is currently monitored for the following issues for this low-risk pregnancy and has Supervision of other normal pregnancy, antepartum and Hx of preeclampsia, prior pregnancy, currently pregnant on their problem list.  Patient reports a lot of life changes right now, still having some anxiety. She reports living with her mother who is HIV (+), but also frequently visiting her FOB in [place], [place]. So her mom doesn't want her around her, because she is afraid she will contract and spread COVID-19 to her. She and FOB are trying to find a place to live together, but "it's hard with how things are now with coronavirus". She has not been seen by [facility] yet and is still interested in speaking with her.  Contractions: Not present. Vag. Bleeding: None.  Movement: Present. Denies leaking of fluid.   The following portions of the patient's history were reviewed and updated as appropriate: allergies, current medications, past family history, past medical history, past social history, past surgical history and problem list.   Objective:   Vitals:   10/19/18 0827 10/19/18 0847 10/19/18 1019  BP: 129/87 134/86 130/81  Pulse: 81 77 79  Temp: (!) 97.5 F (36.4 C)    Weight: 228 lb (103.4 kg)      Fetal Status: Fetal Heart Rate (bpm): 147 Fundal Height: 27 cm Movement: Present     General:  Alert, oriented and cooperative. Patient is in no acute distress.  Skin: Skin is warm and dry. No rash noted.   Cardiovascular: Normal heart rate noted  Respiratory: Normal respiratory effort, no problems with respiration noted  Abdomen: Soft, gravid, appropriate for gestational age.  Pain/Pressure: Present     Pelvic: Cervical exam deferred        Extremities: Normal range of motion.  Edema: None  Mental Status: Normal mood and affect. Normal behavior. Normal judgment and thought  content.   Assessment and Plan:  Pregnancy: G2P1001 at [redacted]w[redacted]d 1. Supervision of other normal pregnancy, antepartum - Glucose Tolerance, 2 Hours w/1 Hour - HIV Antibody (routine testing w rflx) - RPR - CBC - Tdap vaccine greater than or equal to 7yo IM  2. Anxiety during pregnancy in third trimester, antepartum - Ambulatory referral to [place]  Preterm labor symptoms and general obstetric precautions including but not limited to vaginal bleeding, contractions, leaking of fluid and fetal movement were reviewed in detail with the patient. Please refer to After Visit Summary for other counseling recommendations.   Return in about 3 weeks (around 11/09/2018) for Return OB - My Chart video.  Future Appointments  Date Time Provider [place]  10/23/2018  3:00 PM [place] [facility]  10/23/2018  3:00 PM [place] 3 [facility]-M[facility] M[facility]-US  10/25/2018  8:30 AM [place]  11/09/2018  2:30 PM Laury Deep, CNM [facility] None  12/07/2018  1:10 PM Laury Deep, CNM [facility] None  12/14/2018  1:30 PM Laury Deep, CNM [facility] None  12/21/2018  1:10 PM Laury Deep, CNM [facility] None  12/27/2018  1:10 PM Laury Deep, [place] None    Laury Deep, CNM

## 2018-10-19 NOTE — Patient Instructions (Signed)
Fetal Movement Counts  Patient Name: ________________________________________________ Patient Due Date: ____________________  What is a fetal movement count?    A fetal movement count is the number of times that you feel your baby move during a certain amount of time. This may also be called a fetal kick count. A fetal movement count is recommended for every pregnant woman. You may be asked to start counting fetal movements as early as week 28 of your pregnancy.  Pay attention to when your baby is most active. You may notice your baby's sleep and wake cycles. You may also notice things that make your baby move more. You should do a fetal movement count:   When your baby is normally most active.   At the same time each day.  A good time to count movements is while you are resting, after having something to eat and drink.  How do I count fetal movements?  1. Find a quiet, comfortable area. Sit, or lie down on your side.  2. Write down the date, the start time and stop time, and the number of movements that you felt between those two times. Take this information with you to your health care visits.  3. For 2 hours, count kicks, flutters, swishes, rolls, and jabs. You should feel at least 10 movements during 2 hours.  4. You may stop counting after you have felt 10 movements.  5. If you do not feel 10 movements in 2 hours, have something to eat and drink. Then, keep resting and counting for 1 hour. If you feel at least 4 movements during that hour, you may stop counting.  Contact a health care provider if:   You feel fewer than 4 movements in 2 hours.   Your baby is not moving like he or she usually does.  Date: ____________ Start time: ____________ Stop time: ____________ Movements: ____________  Date: ____________ Start time: ____________ Stop time: ____________ Movements: ____________  Date: ____________ Start time: ____________ Stop time: ____________ Movements: ____________  Date: ____________ Start time:  ____________ Stop time: ____________ Movements: ____________  Date: ____________ Start time: ____________ Stop time: ____________ Movements: ____________  Date: ____________ Start time: ____________ Stop time: ____________ Movements: ____________  Date: ____________ Start time: ____________ Stop time: ____________ Movements: ____________  Date: ____________ Start time: ____________ Stop time: ____________ Movements: ____________  Date: ____________ Start time: ____________ Stop time: ____________ Movements: ____________  This information is not intended to replace advice given to you by your health care provider. Make sure you discuss any questions you have with your health care provider.  Document Released: 05/19/2006 Document Revised: 12/17/2015 Document Reviewed: 05/29/2015  Elsevier Interactive Patient Education  2019 Elsevier Inc.    Iron-Rich Diet    Iron is a mineral that helps your body to produce hemoglobin. Hemoglobin is a protein in red blood cells that carries oxygen to your body's tissues. Eating too little iron may cause you to feel weak and tired, and it can increase your risk of infection. Iron is naturally found in many foods, and many foods have iron added to them (iron-fortified foods).  You may need to follow an iron-rich diet if you do not have enough iron in your body due to certain medical conditions. The amount of iron that you need each day depends on your age, your sex, and any medical conditions you have. Follow instructions from your health care provider or a diet and nutrition specialist (dietitian) about how much iron you should eat each day.    What are tips for following this plan?  Reading food labels   Check food labels to see how many milligrams (mg) of iron are in each serving.  Cooking   Cook foods in pots and pans that are made from iron.   Take these steps to make it easier for your body to absorb iron from certain foods:  ? Soak beans overnight before cooking.  ? Soak whole  grains overnight and drain them before using.  ? Ferment flours before baking, such as by using yeast in bread dough.  Meal planning   When you eat foods that contain iron, you should eat them with foods that are high in vitamin C. These include oranges, peppers, tomatoes, potatoes, and mango. Vitamin C helps your body to absorb iron.  General information   Take iron supplements only as told by your health care provider. An overdose of iron can be life-threatening. If you were prescribed iron supplements, take them with orange juice or a vitamin C supplement.   When you eat iron-fortified foods or take an iron supplement, you should also eat foods that naturally contain iron, such as meat, poultry, and fish. Eating naturally iron-rich foods helps your body to absorb the iron that is added to other foods or contained in a supplement.   Certain foods and drinks prevent your body from absorbing iron properly. Avoid eating these foods in the same meal as iron-rich foods or with iron supplements. These foods include:  ? Coffee, black tea, and red wine.  ? Milk, dairy products, and foods that are high in calcium.  ? Beans and soybeans.  ? Whole grains.  What foods should I eat?  Fruits  Prunes. Raisins.  Eat fruits high in vitamin C, such as oranges, grapefruits, and strawberries, alongside iron-rich foods.  Vegetables  Spinach (cooked). Green peas. Broccoli. Fermented vegetables.  Eat vegetables high in vitamin C, such as leafy greens, potatoes, bell peppers, and tomatoes, alongside iron-rich foods.  Grains  Iron-fortified breakfast cereal. Iron-fortified whole-wheat bread. Enriched rice. Sprouted grains.  Meats and other proteins  Beef liver. Oysters. Beef. Shrimp. Turkey. Chicken. Tuna. Sardines. Chickpeas. Nuts. Tofu. Pumpkin seeds.  Beverages  Tomato juice. Fresh orange juice. Prune juice. Hibiscus tea. Fortified instant breakfast shakes.  Sweets and desserts  Blackstrap molasses.  Seasonings and  condiments  Tahini. Fermented soy sauce.  Other foods  Wheat germ.  The items listed above may not be a complete list of recommended foods and beverages. Contact a dietitian for more information.  What foods should I avoid?  Grains  Whole grains. Bran cereal. Bran flour. Oats.  Meats and other proteins  Soybeans. Products made from soy protein. Black beans. Lentils. Mung beans. Split peas.  Dairy  Milk. Cream. Cheese. Yogurt. Cottage cheese.  Beverages  Coffee. Black tea. Red wine.  Sweets and desserts  Cocoa. Chocolate. Ice cream.  Other foods  Basil. Oregano. Large amounts of parsley.  The items listed above may not be a complete list of foods and beverages to avoid. Contact a dietitian for more information.  Summary   Iron is a mineral that helps your body to produce hemoglobin. Hemoglobin is a protein in red blood cells that carries oxygen to your body's tissues.   Iron is naturally found in many foods, and many foods have iron added to them (iron-fortified foods).   When you eat foods that contain iron, you should eat them with foods that are high in vitamin C. Vitamin C helps your   body to absorb iron.   Certain foods and drinks prevent your body from absorbing iron properly, such as whole grains and dairy products. You should avoid eating these foods in the same meal as iron-rich foods or with iron supplements.  This information is not intended to replace advice given to you by your health care provider. Make sure you discuss any questions you have with your health care provider.  Document Released: 12/01/2004 Document Revised: 03/15/2017 Document Reviewed: 03/15/2017  Elsevier Interactive Patient Education  2019 Elsevier Inc.

## 2018-10-20 LAB — CBC
Hematocrit: 36 % (ref 34.0–46.6)
Hemoglobin: 11.7 g/dL (ref 11.1–15.9)
MCH: 27.3 pg (ref 26.6–33.0)
MCHC: 32.5 g/dL (ref 31.5–35.7)
MCV: 84 fL (ref 79–97)
Platelets: 340 10*3/uL (ref 150–450)
RBC: 4.29 x10E6/uL (ref 3.77–5.28)
RDW: 13.5 % (ref 11.7–15.4)
WBC: 11.3 10*3/uL — ABNORMAL HIGH (ref 3.4–10.8)

## 2018-10-20 LAB — GLUCOSE TOLERANCE, 2 HOURS W/ 1HR
Glucose, 1 hour: 148 mg/dL (ref 65–179)
Glucose, 2 hour: 86 mg/dL (ref 65–152)
Glucose, Fasting: 88 mg/dL (ref 65–91)

## 2018-10-20 LAB — HIV ANTIBODY (ROUTINE TESTING W REFLEX): HIV Screen 4th Generation wRfx: NONREACTIVE

## 2018-10-20 LAB — RPR: RPR Ser Ql: NONREACTIVE

## 2018-10-23 ENCOUNTER — Ambulatory Visit (HOSPITAL_COMMUNITY): Payer: Medicaid Other

## 2018-10-23 ENCOUNTER — Ambulatory Visit (HOSPITAL_COMMUNITY): Payer: Medicaid Other | Attending: Obstetrics and Gynecology

## 2018-10-23 ENCOUNTER — Encounter (HOSPITAL_COMMUNITY): Payer: Self-pay

## 2018-10-24 ENCOUNTER — Ambulatory Visit (HOSPITAL_COMMUNITY): Payer: Medicaid Other

## 2018-10-25 ENCOUNTER — Ambulatory Visit: Payer: Medicaid Other | Admitting: Clinical

## 2018-10-25 ENCOUNTER — Other Ambulatory Visit: Payer: Self-pay

## 2018-10-25 NOTE — BH Specialist Note (Signed)
Pt did not arrive to [facility] video visit and did not answer the phone; Left HIPPA-compliant message to call back Roselyn Reef from Center for [employer] at [phone], and left a MyChart message.   Integrated Behavioral Health Visit via [place] video  10/25/2018 Julia Torres [id number]   Garlan Fair

## 2018-11-09 ENCOUNTER — Other Ambulatory Visit: Payer: Self-pay

## 2018-11-09 ENCOUNTER — Encounter: Payer: Medicaid Other | Admitting: Obstetrics and Gynecology

## 2018-11-09 ENCOUNTER — Telehealth (INDEPENDENT_AMBULATORY_CARE_PROVIDER_SITE_OTHER): Payer: Medicaid Other | Admitting: Obstetrics and Gynecology

## 2018-11-09 ENCOUNTER — Encounter: Payer: Self-pay | Admitting: Obstetrics and Gynecology

## 2018-11-09 DIAGNOSIS — Z3A32 32 weeks gestation of pregnancy: Secondary | ICD-10-CM

## 2018-11-09 DIAGNOSIS — O09293 Supervision of pregnancy with other poor reproductive or obstetric history, third trimester: Secondary | ICD-10-CM

## 2018-11-09 DIAGNOSIS — O09299 Supervision of pregnancy with other poor reproductive or obstetric history, unspecified trimester: Secondary | ICD-10-CM

## 2018-11-09 DIAGNOSIS — Z3480 Encounter for supervision of other normal pregnancy, unspecified trimester: Secondary | ICD-10-CM

## 2018-11-09 NOTE — Progress Notes (Addendum)
   MY CHART VIDEO VIRTUAL OBSTETRICS VISIT ENCOUNTER NOTE  I connected with Julia Torres on 11/10/18 at  2:30 PM EDT by My Chart video at home and verified that I am speaking with the correct person using two identifiers.   I discussed the limitations, risks, security and privacy concerns of performing an evaluation and management service by My Chart video and the availability of in person appointments. I also discussed with the patient that there may be a patient responsible charge related to this service. The patient expressed understanding and agreed to proceed.  Subjective:  Julia Torres is a 33 y.o. G2P1001 at [redacted]w[redacted]d being followed for ongoing prenatal care.  She is currently monitored for the following issues for this low-risk pregnancy and has Supervision of other normal pregnancy, antepartum and Hx of preeclampsia, prior pregnancy, currently pregnant on their problem list.  Patient reports swelling. Reports fetal movement. Denies any contractions, bleeding or leaking of fluid.   The following portions of the patient's history were reviewed and updated as appropriate: allergies, current medications, past family history, past medical history, past social history, past surgical history and problem list.   Objective:   General:  Alert, oriented and cooperative.   Mental Status: Normal mood and affect perceived. Normal judgment and thought content.  Rest of physical exam deferred due to type of encounter  BP 113/87   Pulse (!) 122   LMP 03/18/2018 (Approximate)  **Done my patient's own at home BP cuff and scale Repeated: 126/93, 129/99, 122/86, 122/90 Repeated after rest: 117/77 Assessment and Plan:  Pregnancy: G2P1001 at [redacted]w[redacted]d  1. Supervision of other normal pregnancy, antepartum  2. Hx of preeclampsia, prior pregnancy, currently pregnant - Advised to seek care at nearest [facility] serviced hospital for BP of 140/90, H/A that is not relieved by Tylenol, blurry vision or spots/floaters in  vision - Advised to rest for 30-45 mins and retake BP - Advised variable BP values could be d/t movement and not be true values  Preterm labor symptoms and general obstetric precautions including but not limited to vaginal bleeding, contractions, leaking of fluid and fetal movement were reviewed in detail with the patient.  I discussed the assessment and treatment plan with the patient. The patient was provided an opportunity to ask questions and all were answered. The patient agreed with the plan and demonstrated an understanding of the instructions. The patient was advised to call back or seek an in-person office evaluation/go to MAU at [facility] for any urgent or concerning symptoms. Please refer to After Visit Summary for other counseling recommendations.   I provided 10 minutes of non-face-to-face time during this encounter. There was 5 minutes of chart review time spent prior to this encounter. Total time spent = 15 minutes.  Return in about 4 weeks (around 12/07/2018) for Return OB w/GBS.  Future Appointments  Date Time Provider [place]  12/07/2018  1:10 PM Laury Deep, CNM [facility] None  12/07/2018  2:15 PM Narcissa NURSE [place] [facility]  12/07/2018  2:15 PM [place] [facility]  12/14/2018  1:30 PM Laury Deep, CNM [facility] None  12/21/2018  1:10 PM Laury Deep, CNM [facility] None  12/27/2018  1:10 PM Laury Deep, CNM [facility] None    Laury Deep, [place] for [employer], [place]

## 2018-12-04 ENCOUNTER — Telehealth (INDEPENDENT_AMBULATORY_CARE_PROVIDER_SITE_OTHER): Payer: Medicaid Other | Admitting: Clinical

## 2018-12-04 ENCOUNTER — Other Ambulatory Visit: Payer: Self-pay

## 2018-12-04 DIAGNOSIS — F4323 Adjustment disorder with mixed anxiety and depressed mood: Secondary | ICD-10-CM

## 2018-12-04 NOTE — BH Specialist Note (Signed)
Integrated Behavioral Health via Telemedicine Video Visit  12/04/2018 Julia Torres [id number]  Number of [place] visits: 1 Session Start time: 2:30  Session End time: 3:30 Total time: 1 hour  Referring Provider: Laury Deep, CNM Type of Visit: Video Patient/Family location: [facility] Provider location: [facility] All persons participating in visit: Patient Julia Torres and [place]  Confirmed patient's address: Yes  Confirmed patient's phone number: Yes  Any changes to demographics: No   Confirmed patient's insurance: Yes  Any changes to patient's insurance: No   Discussed confidentiality: Yes   I connected with Darika Ildefonso  by a video enabled telemedicine application and verified that I am speaking with the correct person using two identifiers.     I discussed the limitations of evaluation and management by telemedicine and the availability of in person appointments.  I discussed that the purpose of this visit is to provide behavioral health care while limiting exposure to the novel coronavirus.   Discussed there is a possibility of technology failure and discussed alternative modes of communication if that failure occurs.  I discussed that engaging in this video visit, they consent to the provision of behavioral healthcare and the services will be billed under their insurance.  Patient and/or legal guardian expressed understanding and consented to video visit: Yes   PRESENTING CONCERNS: Patient and/or family reports the following symptoms/concerns: Pt states she is feeling overwhelmed with many life changes during current pregnancy (move to [place] from [place], unable to renew CNA licensure, older son's father passed away, coronovirus pandemic), and upcoming move to [facility] after birth; pt open to learning self-coping strategy along with resources for social support postpartum. Pt has thoughts of death, but not harm to self, and no plan.  Duration of  problem: Current pregnancy; Severity of problem: moderate  STRENGTHS (Protective Factors/Coping Skills): Self-aware; resilient  GOALS ADDRESSED: Patient will: 1.  Reduce symptoms of: anxiety, depression and stress  2.  Increase knowledge and/or ability of: coping skills and healthy habits  3.  Demonstrate ability to: Increase healthy adjustment to current life circumstances, Increase adequate support systems for patient/family and Increase motivation to adhere to plan of care  INTERVENTIONS: Interventions utilized:  Mindfulness or Psychologist, educational, Psychoeducation and/or Health Education and Link to [employer] Standardized Assessments completed: GAD-7 and PHQ 9  ASSESSMENT: Patient currently experiencing Adjustment disorder with mixed anxious and depressed mood  Patient may benefit from .psychoeducation and brief therapeutic interventions regarding coping with symptoms of anxiety, depression, and stress. Marland Kitchen  PLAN: 1. Follow up with behavioral health clinician on : One week 2. Behavioral recommendations:  -Post Mobile Crisis number to access if needed, as discussed -CALM relaxation breathing exercise twice daily (morning; evening) -Continue taking prenatal vitamin daily -[place] online (and share with FOB); consider practice drive to hospital, to prepare ahead of time - 3. Referral(s): Integrated Orthoptist (In Clinic) and [employer] Resources:  New mom support  I discussed the assessment and treatment plan with the patient and/or parent/guardian. They were provided an opportunity to ask questions and all were answered. They agreed with the plan and demonstrated an understanding of the instructions.   They were advised to call back or seek an in-person evaluation if the symptoms worsen or if the condition fails to improve as anticipated.  Caroleen Hamman McMannes  Depression screen [facility] 2/9 12/04/2018 10/19/2018  Decreased Interest 1 0   Down, Depressed, Hopeless 2 0  PHQ - 2 Score 3 0  Altered sleeping 3 -  Tired, decreased energy 2 -  Change in appetite 2 -  Feeling bad or failure about yourself  3 -  Trouble concentrating 1 -  Moving slowly or fidgety/restless 1 -  Suicidal thoughts 1 -  PHQ-9 Score 16 -   GAD 7 : Generalized Anxiety Score 12/04/2018  Nervous, Anxious, on Edge 2  Control/stop worrying 1  Worry too much - different things 1  Trouble relaxing 0  Restless 0  Easily annoyed or irritable 1  Afraid - awful might happen 1  Total GAD 7 Score 6

## 2018-12-07 ENCOUNTER — Ambulatory Visit (HOSPITAL_COMMUNITY)
Admission: RE | Admit: 2018-12-07 | Discharge: 2018-12-07 | Disposition: A | Discharge status: Home / Self Care | Payer: Medicaid Other | Source: Ambulatory Visit | Attending: Maternal & Fetal Medicine | Admitting: Maternal & Fetal Medicine

## 2018-12-07 ENCOUNTER — Other Ambulatory Visit (HOSPITAL_COMMUNITY): Payer: Self-pay | Admitting: Maternal & Fetal Medicine

## 2018-12-07 ENCOUNTER — Other Ambulatory Visit (HOSPITAL_COMMUNITY)
Admission: RE | Admit: 2018-12-07 | Discharge: 2018-12-07 | Disposition: A | Discharge status: Home / Self Care | Payer: Medicaid Other | Source: Ambulatory Visit | Attending: Obstetrics and Gynecology | Admitting: Obstetrics and Gynecology

## 2018-12-07 ENCOUNTER — Other Ambulatory Visit: Payer: Self-pay

## 2018-12-07 ENCOUNTER — Encounter: Payer: Self-pay | Admitting: General Practice

## 2018-12-07 ENCOUNTER — Ambulatory Visit (HOSPITAL_COMMUNITY): Payer: Medicaid Other | Admitting: *Deleted

## 2018-12-07 ENCOUNTER — Encounter: Payer: Medicaid Other | Admitting: Obstetrics and Gynecology

## 2018-12-07 ENCOUNTER — Ambulatory Visit (INDEPENDENT_AMBULATORY_CARE_PROVIDER_SITE_OTHER): Payer: Medicaid Other | Admitting: Obstetrics and Gynecology

## 2018-12-07 ENCOUNTER — Encounter (HOSPITAL_COMMUNITY): Payer: Self-pay

## 2018-12-07 DIAGNOSIS — O133 Gestational [pregnancy-induced] hypertension without significant proteinuria, third trimester: Secondary | ICD-10-CM

## 2018-12-07 DIAGNOSIS — O99333 Smoking (tobacco) complicating pregnancy, third trimester: Secondary | ICD-10-CM | POA: Diagnosis not present

## 2018-12-07 DIAGNOSIS — Z362 Encounter for other antenatal screening follow-up: Secondary | ICD-10-CM | POA: Insufficient documentation

## 2018-12-07 DIAGNOSIS — O99213 Obesity complicating pregnancy, third trimester: Secondary | ICD-10-CM

## 2018-12-07 DIAGNOSIS — Z3480 Encounter for supervision of other normal pregnancy, unspecified trimester: Secondary | ICD-10-CM

## 2018-12-07 DIAGNOSIS — O09299 Supervision of pregnancy with other poor reproductive or obstetric history, unspecified trimester: Secondary | ICD-10-CM | POA: Diagnosis not present

## 2018-12-07 DIAGNOSIS — Z3A36 36 weeks gestation of pregnancy: Secondary | ICD-10-CM

## 2018-12-07 DIAGNOSIS — O289 Unspecified abnormal findings on antenatal screening of mother: Secondary | ICD-10-CM

## 2018-12-07 DIAGNOSIS — O139 Gestational [pregnancy-induced] hypertension without significant proteinuria, unspecified trimester: Secondary | ICD-10-CM | POA: Insufficient documentation

## 2018-12-07 DIAGNOSIS — O09293 Supervision of pregnancy with other poor reproductive or obstetric history, third trimester: Secondary | ICD-10-CM | POA: Diagnosis not present

## 2018-12-07 NOTE — Progress Notes (Signed)
   PRENATAL VISIT NOTE  Subjective:  Julia Torres is a 33 y.o. G2P1001 at 65w6dbeing seen today for ongoing prenatal care.  She is currently monitored for the following issues for this low-risk pregnancy and has Supervision of other normal pregnancy, antepartum; Hx of preeclampsia, prior pregnancy, currently pregnant; and Gestational hypertension on their problem list.  Patient reports no complaints.  Contractions: Not present. Vag. Bleeding: None.  Movement: Present. Denies leaking of fluid.   The following portions of the patient's history were reviewed and updated as appropriate: allergies, current medications, past family history, past medical history, past social history, past surgical history and problem list.   Objective:   Vitals:   12/07/18 1314 12/07/18 1355  BP: 132/90 126/78  Pulse: 89 80  Temp: 98.3 F (36.8 C)   Weight: 232 lb (105.2 kg)     Fetal Status: Fetal Heart Rate (bpm): 140   Movement: Present     General:  Alert, oriented and cooperative. Patient is in no acute distress.  Skin: Skin is warm and dry. No rash noted.   Cardiovascular: Normal heart rate noted  Respiratory: Normal respiratory effort, no problems with respiration noted  Abdomen: Soft, gravid, appropriate for gestational age.  Pain/Pressure: Present     Pelvic: Cervical exam performed        Extremities: Normal range of motion.  Edema: None  Mental Status: Normal mood and affect. Normal behavior. Normal judgment and thought content.   Assessment and Plan:  Pregnancy: G2P1001 at 383w6d. Supervision of other normal pregnancy, antepartum  - Culture, beta strep (group b only) - Cervicovaginal ancillary only( ) -  BP 132/90,  This is the second elevated BP in this pregnancy.  First elevated BP was 5/26: 142/88  2. Gestational hypertension, third trimester  - CBC w/Diff - Comp Met (CMET) - Protein / creatinine ratio, urine- labs not collected today. Will collect when patient  arrives to MAU tomorrow for foley.  - Go to MAU tomorrow for induction of labor with foley bulb insertion - induction is scheduled for 8/8 @ 0830 am. Orders placed.   Term labor symptoms and general obstetric precautions including but not limited to vaginal bleeding, contractions, leaking of fluid and fetal movement were reviewed in detail with the patient. Please refer to After Visit Summary for other counseling recommendations.   No follow-ups on file.  Future Appointments  Date Time Provider [place] 18/02/2019  1:45 PM [place]

## 2018-12-07 NOTE — Patient Instructions (Signed)

## 2018-12-08 ENCOUNTER — Other Ambulatory Visit: Payer: Self-pay

## 2018-12-08 ENCOUNTER — Inpatient Hospital Stay (EMERGENCY_DEPARTMENT_HOSPITAL)
Admission: AD | Admit: 2018-12-08 | Discharge: 2018-12-08 | Disposition: A | Discharge status: Home / Self Care | Payer: Medicaid Other | Source: Home / Self Care | Attending: Obstetrics & Gynecology | Admitting: Obstetrics & Gynecology

## 2018-12-08 ENCOUNTER — Inpatient Hospital Stay (HOSPITAL_COMMUNITY)
Admission: AD | Admit: 2018-12-08 | Discharge: 2018-12-09 | Disposition: A | Discharge status: Home / Self Care | Payer: Medicaid Other | Source: Home / Self Care | Attending: Obstetrics & Gynecology | Admitting: Obstetrics & Gynecology

## 2018-12-08 ENCOUNTER — Encounter (HOSPITAL_COMMUNITY): Payer: Self-pay

## 2018-12-08 DIAGNOSIS — O479 False labor, unspecified: Secondary | ICD-10-CM

## 2018-12-08 DIAGNOSIS — O133 Gestational [pregnancy-induced] hypertension without significant proteinuria, third trimester: Secondary | ICD-10-CM

## 2018-12-08 DIAGNOSIS — Z3A37 37 weeks gestation of pregnancy: Secondary | ICD-10-CM

## 2018-12-08 DIAGNOSIS — O4693 Antepartum hemorrhage, unspecified, third trimester: Secondary | ICD-10-CM

## 2018-12-08 DIAGNOSIS — Z3480 Encounter for supervision of other normal pregnancy, unspecified trimester: Secondary | ICD-10-CM

## 2018-12-08 DIAGNOSIS — O09299 Supervision of pregnancy with other poor reproductive or obstetric history, unspecified trimester: Secondary | ICD-10-CM

## 2018-12-08 HISTORY — DX: Depression, unspecified: F32.A

## 2018-12-08 HISTORY — DX: Anxiety disorder, unspecified: F41.9

## 2018-12-08 NOTE — MAU Provider Note (Signed)
   MAU VISIT NOTE  Subjective:  Julia Torres is a 33 y.o. G3P1011 at [redacted]w[redacted]d being seen in MAU today for outpatient Foley balloon insertion.  She is currently monitored for the following issues for this low-risk pregnancy and has Supervision of other normal pregnancy, antepartum; Hx of preeclampsia, prior pregnancy, currently pregnant; and Gestational hypertension on their problem list.  Patient reports no complaints.  Vag. Bleeding: None.  Denies leaking of fluid.   The following portions of the patient's history were reviewed and updated as appropriate: allergies, current medications, past family history, past medical history, past social history, past surgical history and problem list. Problem list updated.  Objective:   Vitals:   12/08/18 1859  BP: 115/82  Pulse: (!) 107  Resp: 18  Temp: 98.8 F (37.1 C)  TempSrc: Oral  SpO2: 100%    Fetal Status: Presentation: Vertex  General:  Alert, oriented and cooperative. Patient is in no acute distress.  Skin: Skin is warm and dry. No rash noted.   Cardiovascular: Normal heart rate noted  Respiratory: Normal respiratory effort, no problems with respiration noted  Abdomen: Soft, gravid, appropriate for gestational age.        Pelvic: Cervical exam performed Dilation: 1 Effacement (%): 60 Station: -3  Extremities: Normal range of motion.    Mental Status:  Normal mood and affect. Normal behavior. Normal judgment and thought content.  Procedure: Patient informed of R/B/A of procedure. NST was performed and was reactive prior to procedure. NST:  EFM: Baseline: 130 bpm Toco: UI noted Procedure done to begin ripening of the cervix prior to admission for induction of labor. Appropriate time out taken. The patient was placed in the lithotomy position and a cervical exam was performed and a finger was used to guide the 55F foley balloon through the internal os of the cervix. Foley Balloon filled with 60cc of sterile water. Plug inserted into  end of the foley. Foley placed on tension and taped to medial thigh.  NST:  EFM Baseline: 135 bpm  Toco: UI Noted There were no signs of tachysystole or hypertonus. All equipment was removed and accounted for. The patient tolerated the procedure well.  Assessment and Plan:  Pregnancy: G3P1011 at [redacted]w[redacted]d Gestational hypertension, third trimester  - Discharge patient,  - Keep Direct Admission appt at 0830 on 12/09/18 - Information provided on cervical balloon insertion and IOL     S/p Outpatient placement of foley balloon catheter for cervical ripening. Induction of labor scheduled for tomorrow at 0830 am. Reassuring FHR tracing with no concerns at present. Warning signs given to patient to include return to MAU for heavy vaginal bleeding, Rupture of membranes, painful uterine contractions q 5 mins or less, severe abdominal discomfort, decreased fetal movement.   Laury Deep, CNM 12/08/2018 7:26 PM

## 2018-12-08 NOTE — Discharge Instructions (Signed)
OUTPATIENT FOLEY BULB INDUCTION OF LABOR:  Information Sheet for Mothers and Family               Whats a Foley Bulb Induction? A Foley bulb induction is a procedure where your provider inserts a catheter into your cervix. Once inside your womb, your provider inflates the balloon with a saline solution.   This puts pressure on your cervix and encourages dilation. The catheter falls out once your cervix dilates to 3-4 centimeters.     With any procedure, its important that you know what to expect. The insertion of a Foley catheter can be a bit uncomfortable, and some women experience sharp pelvic pain. The pain may subside once the catheter is in place. You may experience some cramping when the Foley catheter is in place.  This is normal.     GO TO THE MATERNITY ASSESSMENT UNIT FOR THE FOLLOWING:  Heavy vaginal bleeding  Rupture of membranes (fluid that wets your underwear)  Painful uterine contractions every 5 minutes or less  Severe abdominal discomfort  Decreased movement of the baby

## 2018-12-08 NOTE — MAU Note (Signed)
Pt here for foley bulb placement.  Denies LOF, VB, or UCs.  States +FM.

## 2018-12-09 ENCOUNTER — Inpatient Hospital Stay (HOSPITAL_COMMUNITY)
Admission: AD | Admit: 2018-12-09 | Discharge: 2018-12-11 | DRG: 807 | Disposition: A | Discharge status: Home / Self Care | Payer: Medicaid Other | Attending: Obstetrics & Gynecology | Admitting: Obstetrics & Gynecology

## 2018-12-09 ENCOUNTER — Other Ambulatory Visit: Payer: Self-pay

## 2018-12-09 ENCOUNTER — Encounter (HOSPITAL_COMMUNITY): Payer: Self-pay | Admitting: *Deleted

## 2018-12-09 ENCOUNTER — Encounter (HOSPITAL_COMMUNITY): Payer: Self-pay

## 2018-12-09 DIAGNOSIS — O09299 Supervision of pregnancy with other poor reproductive or obstetric history, unspecified trimester: Secondary | ICD-10-CM

## 2018-12-09 DIAGNOSIS — O99334 Smoking (tobacco) complicating childbirth: Secondary | ICD-10-CM | POA: Diagnosis present

## 2018-12-09 DIAGNOSIS — F17210 Nicotine dependence, cigarettes, uncomplicated: Secondary | ICD-10-CM | POA: Diagnosis present

## 2018-12-09 DIAGNOSIS — Z20828 Contact with and (suspected) exposure to other viral communicable diseases: Secondary | ICD-10-CM

## 2018-12-09 DIAGNOSIS — O134 Gestational [pregnancy-induced] hypertension without significant proteinuria, complicating childbirth: Principal | ICD-10-CM | POA: Diagnosis present

## 2018-12-09 DIAGNOSIS — O164 Unspecified maternal hypertension, complicating childbirth: Secondary | ICD-10-CM | POA: Diagnosis not present

## 2018-12-09 DIAGNOSIS — O133 Gestational [pregnancy-induced] hypertension without significant proteinuria, third trimester: Secondary | ICD-10-CM | POA: Diagnosis present

## 2018-12-09 DIAGNOSIS — Z3A37 37 weeks gestation of pregnancy: Secondary | ICD-10-CM | POA: Diagnosis not present

## 2018-12-09 DIAGNOSIS — Z3480 Encounter for supervision of other normal pregnancy, unspecified trimester: Secondary | ICD-10-CM

## 2018-12-09 LAB — CBC
HCT: 36 % (ref 36.0–46.0)
HCT: 32.6 % — ABNORMAL LOW (ref 36.0–46.0)
Hemoglobin: 11.6 g/dL — ABNORMAL LOW (ref 12.0–15.0)
Hemoglobin: 10.3 g/dL — ABNORMAL LOW (ref 12.0–15.0)
MCH: 26.2 pg (ref 26.0–34.0)
MCH: 26.1 pg (ref 26.0–34.0)
MCHC: 32.2 g/dL (ref 30.0–36.0)
MCHC: 31.6 g/dL (ref 30.0–36.0)
MCV: 81.3 fL (ref 80.0–100.0)
MCV: 82.5 fL (ref 80.0–100.0)
Platelets: 405 10*3/uL — ABNORMAL HIGH (ref 150–400)
Platelets: 345 10*3/uL (ref 150–400)
RBC: 4.43 MIL/uL (ref 3.87–5.11)
RBC: 3.95 MIL/uL (ref 3.87–5.11)
RDW: 15.4 % (ref 11.5–15.5)
RDW: 15.7 % — ABNORMAL HIGH (ref 11.5–15.5)
WBC: 15.4 10*3/uL — ABNORMAL HIGH (ref 4.0–10.5)
WBC: 13.1 10*3/uL — ABNORMAL HIGH (ref 4.0–10.5)
nRBC: 0 % (ref 0.0–0.2)
nRBC: 0 % (ref 0.0–0.2)

## 2018-12-09 LAB — PROTEIN / CREATININE RATIO, URINE
Creatinine, Urine: 128.56 mg/dL
Protein Creatinine Ratio: 0.09 mg/mg{Cre} (ref 0.00–0.15)
Total Protein, Urine: 11 mg/dL

## 2018-12-09 LAB — CERVICOVAGINAL ANCILLARY ONLY
Chlamydia: NEGATIVE
Neisseria Gonorrhea: NEGATIVE
Trichomonas: NEGATIVE

## 2018-12-09 LAB — SARS CORONAVIRUS 2 BY RT PCR (HOSPITAL ORDER, PERFORMED IN ~~LOC~~ HOSPITAL LAB): SARS Coronavirus 2: NEGATIVE

## 2018-12-09 LAB — POCT FERN TEST: POCT Fern Test: NEGATIVE

## 2018-12-09 LAB — TYPE AND SCREEN
ABO/RH(D): A POS
Antibody Screen: NEGATIVE

## 2018-12-09 LAB — ABO/RH: ABO/RH(D): A POS

## 2018-12-09 MED ORDER — LACTATED RINGERS IV SOLN
INTRAVENOUS | Status: DC
  Administered 2018-12-09: 18:00:00 via INTRAVENOUS

## 2018-12-09 MED ORDER — ACETAMINOPHEN 325 MG PO TABS: 650.0000 mg | ORAL_TABLET | ORAL | Status: DC | PRN

## 2018-12-09 MED ORDER — OXYCODONE-ACETAMINOPHEN 5-325 MG PO TABS: 2.0000 | ORAL_TABLET | ORAL | Status: DC | PRN

## 2018-12-09 MED ORDER — PHENYLEPHRINE 40 MCG/ML (10ML) SYRINGE FOR IV PUSH (FOR BLOOD PRESSURE SUPPORT): 80.0000 ug | PREFILLED_SYRINGE | INTRAVENOUS | Status: DC | PRN

## 2018-12-09 MED ORDER — SOD CITRATE-CITRIC ACID 500-334 MG/5ML PO SOLN: 30.0000 mL | ORAL | Status: DC | PRN

## 2018-12-09 MED ORDER — DIPHENHYDRAMINE HCL 50 MG/ML IJ SOLN: 12.5000 mg | INTRAMUSCULAR | Status: DC | PRN

## 2018-12-09 MED ORDER — TERBUTALINE SULFATE 1 MG/ML IJ SOLN: 0.2500 mg | Freq: Once | INTRAMUSCULAR | Status: DC | PRN

## 2018-12-09 MED ORDER — OXYTOCIN 40 UNITS IN NORMAL SALINE INFUSION - SIMPLE MED
1.0000 m[IU]/min | INTRAVENOUS | Status: DC
  Administered 2018-12-09: 2 m[IU]/min via INTRAVENOUS

## 2018-12-09 MED ORDER — OXYCODONE-ACETAMINOPHEN 5-325 MG PO TABS: 1.0000 | ORAL_TABLET | ORAL | Status: DC | PRN

## 2018-12-09 MED ORDER — EPHEDRINE 5 MG/ML INJ: 10.0000 mg | INTRAVENOUS | Status: DC | PRN

## 2018-12-09 MED ORDER — MISOPROSTOL 50MCG HALF TABLET
50.0000 ug | ORAL_TABLET | Freq: Once | ORAL | Status: AC
  Administered 2018-12-09: 50 ug via BUCCAL

## 2018-12-09 MED ORDER — OXYTOCIN 40 UNITS IN NORMAL SALINE INFUSION - SIMPLE MED
2.5000 [IU]/h | INTRAVENOUS | Status: DC
  Administered 2018-12-10: 2.5 [IU]/h via INTRAVENOUS
  Filled 2018-12-09: qty 1000

## 2018-12-09 MED ORDER — FENTANYL-BUPIVACAINE-NACL 0.5-0.125-0.9 MG/250ML-% EP SOLN
12.0000 mL/h | EPIDURAL | Status: DC | PRN
  Filled 2018-12-09: qty 250

## 2018-12-09 MED ORDER — OXYTOCIN BOLUS FROM INFUSION
500.0000 mL | Freq: Once | INTRAVENOUS | Status: AC
  Administered 2018-12-10: 05:00:00 500 mL via INTRAVENOUS

## 2018-12-09 MED ORDER — PHENYLEPHRINE 40 MCG/ML (10ML) SYRINGE FOR IV PUSH (FOR BLOOD PRESSURE SUPPORT)
80.0000 ug | PREFILLED_SYRINGE | INTRAVENOUS | Status: DC | PRN
  Filled 2018-12-09: qty 10

## 2018-12-09 MED ORDER — LACTATED RINGERS IV SOLN: 500.0000 mL | INTRAVENOUS | Status: DC | PRN

## 2018-12-09 MED ORDER — LACTATED RINGERS IV SOLN: 500.0000 mL | Freq: Once | INTRAVENOUS | Status: DC

## 2018-12-09 MED ORDER — LIDOCAINE HCL (PF) 1 % IJ SOLN: 30.0000 mL | INTRAMUSCULAR | Status: DC | PRN

## 2018-12-09 MED ORDER — MISOPROSTOL 50MCG HALF TABLET
ORAL_TABLET | ORAL | Status: AC
  Filled 2018-12-09: qty 1

## 2018-12-09 MED ORDER — ONDANSETRON HCL 4 MG/2ML IJ SOLN
4.0000 mg | Freq: Four times a day (QID) | INTRAMUSCULAR | Status: DC | PRN
  Administered 2018-12-10: 4 mg via INTRAVENOUS
  Filled 2018-12-09: qty 2

## 2018-12-09 NOTE — Progress Notes (Signed)
Julia Torres is a 34 y.o. G3P1011 at [redacted]w[redacted]d   Subjective: Denies pain, resting comfortably in room with FOB at bedside, providing positive distraction  Objective: BP 133/88   Pulse 68   Temp 97.9 F (36.6 C)   LMP 03/18/2018 (Approximate)  No intake/output data recorded. No intake/output data recorded.  FHT:  FHR: 135 bpm, variability: moderate,  accelerations:  Present,  decelerations:  Absent UC:   irregular, every 3-10 minutes SVE:   Dilation: 4 Effacement (%): 50 Station: -3 Exam by:: sam cnm  Labs: Lab Results  Component Value Date   WBC 15.4 (H) 12/09/2018   HGB 11.6 (L) 12/09/2018   HCT 36.0 12/09/2018   MCV 81.3 12/09/2018   PLT 405 (H) 12/09/2018    Assessment / Plan: --33 y.o. G3P1011 at [redacted]w[redacted]d, PDIOL --Category I tracing --S/p 50 mcg buccal Cytotec at 1108 am --Start Pitocin 2 x 2 now, titrate PRN --Discussed IV Fentanyl for labor pain prior to 8cm, or as bridge to epidural --Gestational Hypertension: normotensive throughout admission --Anticipate NSVD  Darlina Rumpf, CNM 12/09/2018, 3:13 PM

## 2018-12-09 NOTE — MAU Note (Signed)
Pt here earlier for foley bulb placement. For IOL at 0830. About 2245 went to have BM. Noticed fld in panties and some blood. Also foley bulb came out when bearing down to have BM. Has not leaked anymore fld. No pain currently. Good FM

## 2018-12-09 NOTE — H&P (Signed)
Julia Torres is a 33 y.o. female presenting for IOL for Gestational Hypertension. She is s/p foley bulb placement in MAU 12/08/18. Her foley bulb dislodged around 2245 last night. She endorses a scant amount of bloody show with foley bulb dislodgment but denies ongoing vaginal bleeding, leaking of fluid, decreased fetal movement, fever, falls, or recent illness.   Prenatal History -[facility] -Dating by LMP -Care initiated at 17w 4d -Atypical NIPS, normal Amnio -Rubella non-immune -Flu declined -TDAP 10/19/18  OB History    Gravida  3   Para  1   Term  1   Preterm      AB  1   Living  1     SAB      TAB  1   Ectopic      Multiple      Live Births  1          Past Medical History:  Diagnosis Date  . Anxiety   . Depression   . Pregnancy induced hypertension    Past Surgical History:  Procedure Laterality Date  . NO PAST SURGERIES     Family History: family history includes HIV in her mother. Social History:  reports that she has been smoking cigarettes. She has been smoking about 0.25 packs per day. She has never used smokeless tobacco. She reports previous alcohol use. She reports current drug use. Drug: Marijuana.     Maternal Diabetes: No Genetic Screening: Abnormal:  Results: Normal amniocentesis Maternal Ultrasounds/Referrals: Normal Fetal Ultrasounds or other Referrals:  None Maternal Substance Abuse:  No Significant Maternal Medications:  None Significant Maternal Lab Results:  Other: GBS collected Other Comments:  None  Review of Systems  Constitutional: Negative for fever.  Eyes: Negative for blurred vision.  Respiratory: Negative for shortness of breath.   Gastrointestinal: Negative for abdominal pain.  Musculoskeletal: Negative for back pain.  Neurological: Negative for weakness and headaches.  All other systems reviewed and are negative.  Dilation: 4 Effacement (%): Thick Station: -3 Exam by:: m wilkins rnc Blood pressure  118/83, pulse (!) 135, temperature 97.9 F (36.6 C), last menstrual period 03/18/2018. Physical Exam  Nursing note and vitals reviewed. Constitutional: She is oriented to person, place, and time. She appears well-developed and well-nourished.  Cardiovascular: Normal rate.  Respiratory: Effort normal and breath sounds normal. No respiratory distress.  GI: Soft. She exhibits no distension. There is no abdominal tenderness. There is no rebound and no guarding.  Gravid  Neurological: She is alert and oriented to person, place, and time.  Skin: Skin is warm and dry.  Psychiatric: She has a normal mood and affect. Her behavior is normal. Judgment and thought content normal.    Prenatal labs: ABO, Rh: A/Positive/-- (02/17 1002) Antibody: Negative (02/17 1002) Rubella: <0.90 (02/17 1002) RPR: Non Reactive (06/18 0812)  HBsAg: Negative (02/17 1002)  HIV: Non Reactive (06/18 0812)  GBS:   Collected  Fetal Surveillance Category I tracing Baseline 130, mod variability, 15 x 15 accels, no decels Toco: occasional contractions not felt by patient  Assessment/Plan: --33 y.o. G3P1011 at [redacted]w[redacted]d  --Category I tracing --4 cm but very thick and posterior. Cytotec #1 (buccal) placed at 1108 --Plan to bridge to Pitocin augmentation PRN when 4 hours s/p Cytotec --Planning epidural --Girl/breast/pills or depo --Anticipate NSVD  Darlina Rumpf, CNM 12/09/2018, 1:35 PM

## 2018-12-09 NOTE — MAU Note (Addendum)
Covid swab obtained without difficulty and pt tol well. No symptoms. Pt was scheduled for 0830 IOL this am. IOL time changed to 1000 so pt will have alittle longer to sleep. Pt agrees with poc

## 2018-12-09 NOTE — Discharge Instructions (Signed)
Keep induction appointment for 12/09/2018 at 10 am.   Third Trimester of Pregnancy The third trimester is from week 28 through week 40 (months 7 through 9). The third trimester is a time when the unborn baby (fetus) is growing rapidly. At the end of the ninth month, the fetus is about 20 inches in length and weighs 6-10 pounds. Body changes during your third trimester Your body will continue to go through many changes during pregnancy. The changes vary from woman to woman. During the third trimester:  Your weight will continue to increase. You can expect to gain 25-35 pounds (11-16 kg) by the end of the pregnancy.  You may begin to get stretch marks on your hips, abdomen, and breasts.  You may urinate more often because the fetus is moving lower into your pelvis and pressing on your bladder.  You may develop or continue to have heartburn. This is caused by increased hormones that slow down muscles in the digestive tract.  You may develop or continue to have constipation because increased hormones slow digestion and cause the muscles that push waste through your intestines to relax.  You may develop hemorrhoids. These are swollen veins (varicose veins) in the rectum that can itch or be painful.  You may develop swollen, bulging veins (varicose veins) in your legs.  You may have increased body aches in the pelvis, back, or thighs. This is due to weight gain and increased hormones that are relaxing your joints.  You may have changes in your hair. These can include thickening of your hair, rapid growth, and changes in texture. Some women also have hair loss during or after pregnancy, or hair that feels dry or thin. Your hair will most likely return to normal after your baby is born.  Your breasts will continue to grow and they will continue to become tender. A yellow fluid (colostrum) may leak from your breasts. This is the first milk you are producing for your baby.  Your belly button may  stick out.  You may notice more swelling in your hands, face, or ankles.  You may have increased tingling or numbness in your hands, arms, and legs. The skin on your belly may also feel numb.  You may feel short of breath because of your expanding uterus.  You may have more problems sleeping. This can be caused by the size of your belly, increased need to urinate, and an increase in your body's metabolism.  You may notice the fetus "dropping," or moving lower in your abdomen (lightening).  You may have increased vaginal discharge.  You may notice your joints feel loose and you may have pain around your pelvic bone. What to expect at prenatal visits You will have prenatal exams every 2 weeks until week 36. Then you will have weekly prenatal exams. During a routine prenatal visit:  You will be weighed to make sure you and the baby are growing normally.  Your blood pressure will be taken.  Your abdomen will be measured to track your baby's growth.  The fetal heartbeat will be listened to.  Any test results from the previous visit will be discussed.  You may have a cervical check near your due date to see if your cervix has softened or thinned (effaced).  You will be tested for Group B streptococcus. This happens between 35 and 37 weeks. Your health care provider may ask you:  What your birth plan is.  How you are feeling.  If you are  feeling the baby move.  If you have had any abnormal symptoms, such as leaking fluid, bleeding, severe headaches, or abdominal cramping.  If you are using any tobacco products, including cigarettes, chewing tobacco, and electronic cigarettes.  If you have any questions. Other tests or screenings that may be performed during your third trimester include:  Blood tests that check for low iron levels (anemia).  Fetal testing to check the health, activity level, and growth of the fetus. Testing is done if you have certain medical conditions or if  there are problems during the pregnancy.  Nonstress test (NST). This test checks the health of your baby to make sure there are no signs of problems, such as the baby not getting enough oxygen. During this test, a belt is placed around your belly. The baby is made to move, and its heart rate is monitored during movement. What is false labor? False labor is a condition in which you feel small, irregular tightenings of the muscles in the womb (contractions) that usually go away with rest, changing position, or drinking water. These are called Braxton Hicks contractions. Contractions may last for hours, days, or even weeks before true labor sets in. If contractions come at regular intervals, become more frequent, increase in intensity, or become painful, you should see your health care provider. What are the signs of labor?  Abdominal cramps.  Regular contractions that start at 10 minutes apart and become stronger and more frequent with time.  Contractions that start on the top of the uterus and spread down to the lower abdomen and back.  Increased pelvic pressure and dull back pain.  A watery or bloody mucus discharge that comes from the vagina.  Leaking of amniotic fluid. This is also known as your "water breaking." It could be a slow trickle or a gush. Let your health care provider know if it has a color or strange odor. If you have any of these signs, call your health care provider right away, even if it is before your due date. Follow these instructions at home: Medicines  Follow your health care provider's instructions regarding medicine use. Specific medicines may be either safe or unsafe to take during pregnancy.  Take a prenatal vitamin that contains at least 600 micrograms (mcg) of folic acid.  If you develop constipation, try taking a stool softener if your health care provider approves. Eating and drinking   Eat a balanced diet that includes fresh fruits and vegetables, whole  grains, good sources of protein such as meat, eggs, or tofu, and low-fat dairy. Your health care provider will help you determine the amount of weight gain that is right for you.  Avoid raw meat and uncooked cheese. These carry germs that can cause birth defects in the baby.  If you have low calcium intake from food, talk to your health care provider about whether you should take a daily calcium supplement.  Eat four or five small meals rather than three large meals a day.  Limit foods that are high in fat and processed sugars, such as fried and sweet foods.  To prevent constipation: ? Drink enough fluid to keep your urine clear or pale yellow. ? Eat foods that are high in fiber, such as fresh fruits and vegetables, whole grains, and beans. Activity  Exercise only as directed by your health care provider. Most women can continue their usual exercise routine during pregnancy. Try to exercise for 30 minutes at least 5 days a week. Stop exercising  if you experience uterine contractions.  Avoid heavy lifting.  Do not exercise in extreme heat or humidity, or at high altitudes.  Wear low-heel, comfortable shoes.  Practice good posture.  You may continue to have sex unless your health care provider tells you otherwise. Relieving pain and discomfort  Take frequent breaks and rest with your legs elevated if you have leg cramps or low back pain.  Take warm sitz baths to soothe any pain or discomfort caused by hemorrhoids. Use hemorrhoid cream if your health care provider approves.  Wear a good support bra to prevent discomfort from breast tenderness.  If you develop varicose veins: ? Wear support pantyhose or compression stockings as told by your healthcare provider. ? Elevate your feet for 15 minutes, 3-4 times a day. Prenatal care  Write down your questions. Take them to your prenatal visits.  Keep all your prenatal visits as told by your health care provider. This is  important. Safety  Wear your seat belt at all times when driving.  Make a list of emergency phone numbers, including numbers for family, friends, the hospital, and police and fire departments. General instructions  Avoid cat litter boxes and soil used by cats. These carry germs that can cause birth defects in the baby. If you have a cat, ask someone to clean the litter box for you.  Do not travel far distances unless it is absolutely necessary and only with the approval of your health care provider.  Do not use hot tubs, steam rooms, or saunas.  Do not drink alcohol.  Do not use any products that contain nicotine or tobacco, such as cigarettes and e-cigarettes. If you need help quitting, ask your health care provider.  Do not use any medicinal herbs or unprescribed drugs. These chemicals affect the formation and growth of the baby.  Do not douche or use tampons or scented sanitary pads.  Do not cross your legs for long periods of time.  To prepare for the arrival of your baby: ? Take prenatal classes to understand, practice, and ask questions about labor and delivery. ? Make a trial run to the hospital. ? Visit the hospital and tour the maternity area. ? Arrange for maternity or paternity leave through employers. ? Arrange for family and friends to take care of pets while you are in the hospital. ? Purchase a rear-facing car seat and make sure you know how to install it in your car. ? Pack your hospital bag. ? Prepare the babys nursery. Make sure to remove all pillows and stuffed animals from the baby's crib to prevent suffocation.  Visit your dentist if you have not gone during your pregnancy. Use a soft toothbrush to brush your teeth and be gentle when you floss. Contact a health care provider if:  You are unsure if you are in labor or if your water has broken.  You become dizzy.  You have mild pelvic cramps, pelvic pressure, or nagging pain in your abdominal area.  You  have lower back pain.  You have persistent nausea, vomiting, or diarrhea.  You have an unusual or bad smelling vaginal discharge.  You have pain when you urinate. Get help right away if:  Your water breaks before 37 weeks.  You have regular contractions less than 5 minutes apart before 37 weeks.  You have a fever.  You are leaking fluid from your vagina.  You have spotting or bleeding from your vagina.  You have severe abdominal pain or cramping.  You have rapid weight loss or weight gain.  You have shortness of breath with chest pain.  You notice sudden or extreme swelling of your face, hands, ankles, feet, or legs.  Your baby makes fewer than 10 movements in 2 hours.  You have severe headaches that do not go away when you take medicine.  You have vision changes. Summary  The third trimester is from week 28 through week 40, months 7 through 9. The third trimester is a time when the unborn baby (fetus) is growing rapidly.  During the third trimester, your discomfort may increase as you and your baby continue to gain weight. You may have abdominal, leg, and back pain, sleeping problems, and an increased need to urinate.  During the third trimester your breasts will keep growing and they will continue to become tender. A yellow fluid (colostrum) may leak from your breasts. This is the first milk you are producing for your baby.  False labor is a condition in which you feel small, irregular tightenings of the muscles in the womb (contractions) that eventually go away. These are called Braxton Hicks contractions. Contractions may last for hours, days, or even weeks before true labor sets in.  Signs of labor can include: abdominal cramps; regular contractions that start at 10 minutes apart and become stronger and more frequent with time; watery or bloody mucus discharge that comes from the vagina; increased pelvic pressure and dull back pain; and leaking of amniotic fluid. This  information is not intended to replace advice given to you by your health care provider. Make sure you discuss any questions you have with your health care provider. Document Released: 04/13/2001 Document Revised: 08/10/2018 Document Reviewed: 05/25/2016 Elsevier Patient Education  2020 [employer]

## 2018-12-09 NOTE — MAU Provider Note (Signed)
Chief Complaint:  Vaginal Discharge and Vaginal Bleeding   First Provider Initiated Contact with Patient 12/09/18 0205      HPI: Julia Torres is a 33 y.o. G3P1011 at 7965w1d who presents to maternity admissions reporting her foley balloon came out last night at 2245. She noted fluid and some blood in her underwear.  She denies any additional fluid or bleeding since this episode. She feels normal fetal movement. She denies any pain.   HPI  Past Medical History: Past Medical History:  Diagnosis Date  . Anxiety   . Depression   . Pregnancy induced hypertension     Past obstetric history: OB History  Gravida Para Term Preterm AB Living  3 1 1   1 1   SAB TAB Ectopic Multiple Live Births    1     1    # Outcome Date GA Lbr Len/2nd Weight Sex Delivery Anes PTL Lv  3 Current           2 Term 04/30/11 6918w0d   M Vag-Spont  N LIV     Complications: Preeclampsia, unspecified trimester  1 TAB             Past Surgical History: Past Surgical History:  Procedure Laterality Date  . NO PAST SURGERIES      Family History: Family History  Problem Relation Age of Onset  . HIV Mother     Social History: Social History   Tobacco Use  . Smoking status: Current Every Day Smoker    Packs/day: 0.25    Types: Cigarettes  . Smokeless tobacco: Never Used  . Tobacco comment: 4-5 cigarettes in  day  Substance Use Topics  . Alcohol use: Not Currently  . Drug use: Yes    Types: Marijuana    Comment: last used in middle of this pregnancy     Allergies: No Known Allergies  Meds:  Medications Prior to Admission  Medication Sig Dispense Refill Last Dose  . Acetaminophen (TYLENOL PO) Take by mouth.     Marland Kitchen. aspirin 81 MG tablet Take 1 tablet (81 mg total) by mouth daily. 30 tablet 6   . Prenatal Vit-Fe Fumarate-FA (PRENATAL VITAMIN PO) Take by mouth.       ROS:  Review of Systems  Constitutional: Negative for chills, fatigue and fever.  Eyes: Negative for visual disturbance.   Respiratory: Negative for shortness of breath.   Cardiovascular: Negative for chest pain.  Gastrointestinal: Negative for abdominal pain, nausea and vomiting.  Genitourinary: Positive for vaginal bleeding and vaginal discharge. Negative for difficulty urinating, dysuria, flank pain, pelvic pain and vaginal pain.  Neurological: Negative for dizziness and headaches.  Psychiatric/Behavioral: Negative.      I have reviewed patient's Past Medical Hx, Surgical Hx, Family Hx, Social Hx, medications and allergies.   Physical Exam   Patient Vitals for the past 24 hrs:  BP Temp Pulse Resp Height Weight  12/09/18 0011 - - - - 5\' 9"  (1.753 m) 105.2 kg  12/09/18 0009 129/77 - 91 - - -  12/09/18 0007 - 97.7 F (36.5 C) - 18 - -   Constitutional: Well-developed, well-nourished female in no acute distress.  Cardiovascular: normal rate Respiratory: normal effort GI: Abd soft, non-tender, gravid appropriate for gestational age.  MS: Extremities nontender, no edema, normal ROM Neurologic: Alert and oriented x 4.  GU: Neg CVAT.  PELVIC EXAM: Cervix visually open, scant bleeding noted on cotton swab but no pooling, no pooling of fluid or blood before or  after valsalva  Dilation: 4 Effacement (%): Thick Cervical Position: Posterior Station: -3 Presentation: Vertex Exam by:: Fatima Blank, CNM  FHT:  Baseline 135 , moderate variability, accelerations present, no decelerations Contractions: 2-10 minutes, mild to palpation    Labs: No results found for this or any previous visit (from the past 24 hour(s)). A/Positive/-- (02/17 1002)  Imaging:    MAU Course/MDM: Orders Placed This Encounter  Procedures  . Contraction - monitoring  . External fetal heart monitoring  . Vaginal exam  . POCT fern test  . Discharge patient    No orders of the defined types were placed in this encounter.    NST reviewed and reactive Foley balloon out with no evidence of active labor No evidence  of PROM with negative ferning and pooling Bleeding is minimal and c/w foley bulb and cervical change Pt with IOL at 0830. D/C home to return at scheduled time for induction Bleeding and labor precautions reviewed  Pt discharge with strict return precautions.    Assessment: 1. False labor   2. Supervision of other normal pregnancy, antepartum   3. Hx of preeclampsia, prior pregnancy, currently pregnant   4. Vaginal bleeding in pregnancy, third trimester     Plan: Discharge home Labor precautions and fetal kick counts  Allergies as of 12/09/2018   No Known Allergies     Medication List    STOP taking these medications   aspirin 81 MG tablet     TAKE these medications   PRENATAL VITAMIN PO Take by mouth.   TYLENOL PO Take by mouth.       Fatima Blank Certified Nurse-Midwife 12/09/2018 2:15 AM

## 2018-12-09 NOTE — Progress Notes (Signed)
LABOR PROGRESS NOTE  Julia Torres is a 33 y.o. G3P1011 at [redacted]w[redacted]d  admitted for IOL for gHTN.   Subjective: Patient comfortable. She is sitting up eating a snack.   Objective: BP 118/78   Pulse 84   Temp 98.3 F (36.8 C) (Oral)   Resp 18   Ht 5\' 9"  (1.753 m)   Wt 105.2 kg   LMP 03/18/2018 (Approximate)   BMI 34.25 kg/m  or  Vitals:   12/09/18 1843 12/09/18 1900 12/09/18 1931 12/09/18 1939  BP: 122/85 133/88 118/78   Pulse: 85 68 84   Resp:   18   Temp:   98.3 F (36.8 C)   TempSrc:   Oral   Weight:    105.2 kg  Height:    5\' 9"  (1.753 m)   Dilation: 4 Effacement (%): 50 Cervical Position: Posterior Station: -3 Presentation: Vertex Exam by:: sam cnm FHT: baseline rate 125, moderate varibility, +acel, occ variable decel Toco: q2-4 min   Labs: Lab Results  Component Value Date   WBC 15.4 (H) 12/09/2018   HGB 11.6 (L) 12/09/2018   HCT 36.0 12/09/2018   MCV 81.3 12/09/2018   PLT 405 (H) 12/09/2018    Patient Active Problem List   Diagnosis Date Noted  . Gestational hypertension, third trimester 12/09/2018  . Gestational hypertension 12/07/2018  . Supervision of other normal pregnancy, antepartum 06/19/2018  . Hx of preeclampsia, prior pregnancy, currently pregnant 06/19/2018    Assessment / Plan: 33 y.o. G3P1011 at [redacted]w[redacted]d here for IOL for gHTN. BPs normotensive.   Labor: Patient on pitocin at 10 mu/min. Titrate as appropriate. Will plan to AROM since patient is a multip once Epidural in place.  Fetal Wellbeing:  Cat II but overall reassuring with moderate variability and acels  Pain Control:  Plans for epidural. Epidural upon maternal request.  Anticipated MOD:  NSVD   Phill Myron, D.O. OB Fellow  12/09/2018, 8:29 PM

## 2018-12-10 ENCOUNTER — Inpatient Hospital Stay (HOSPITAL_COMMUNITY): Payer: Medicaid Other | Admitting: Anesthesiology

## 2018-12-10 ENCOUNTER — Encounter (HOSPITAL_COMMUNITY): Payer: Self-pay

## 2018-12-10 DIAGNOSIS — Z3A37 37 weeks gestation of pregnancy: Secondary | ICD-10-CM

## 2018-12-10 DIAGNOSIS — O134 Gestational [pregnancy-induced] hypertension without significant proteinuria, complicating childbirth: Secondary | ICD-10-CM

## 2018-12-10 LAB — RPR: RPR Ser Ql: NONREACTIVE

## 2018-12-10 MED ORDER — PRENATAL MULTIVITAMIN CH
1.0000 | ORAL_TABLET | Freq: Every day | ORAL | Status: DC
  Administered 2018-12-10 – 2018-12-11 (×2): 1 via ORAL
  Filled 2018-12-10 (×2): qty 1

## 2018-12-10 MED ORDER — TETANUS-DIPHTH-ACELL PERTUSSIS 5-2.5-18.5 LF-MCG/0.5 IM SUSP: 0.5000 mL | Freq: Once | INTRAMUSCULAR | Status: DC

## 2018-12-10 MED ORDER — DIBUCAINE (PERIANAL) 1 % EX OINT: 1.0000 "application " | TOPICAL_OINTMENT | CUTANEOUS | Status: DC | PRN

## 2018-12-10 MED ORDER — IBUPROFEN 600 MG PO TABS
600.0000 mg | ORAL_TABLET | Freq: Four times a day (QID) | ORAL | Status: DC
  Administered 2018-12-10 – 2018-12-11 (×5): 600 mg via ORAL
  Filled 2018-12-10 (×5): qty 1

## 2018-12-10 MED ORDER — SIMETHICONE 80 MG PO CHEW: 80.0000 mg | CHEWABLE_TABLET | ORAL | Status: DC | PRN

## 2018-12-10 MED ORDER — SODIUM CHLORIDE (PF) 0.9 % IJ SOLN
INTRAMUSCULAR | Status: DC | PRN
  Administered 2018-12-10: 12 mL/h via EPIDURAL

## 2018-12-10 MED ORDER — OXYCODONE-ACETAMINOPHEN 5-325 MG PO TABS
1.0000 | ORAL_TABLET | ORAL | Status: DC | PRN
  Administered 2018-12-11: 2 via ORAL
  Filled 2018-12-10: qty 2

## 2018-12-10 MED ORDER — MEASLES, MUMPS & RUBELLA VAC IJ SOLR
0.5000 mL | Freq: Once | INTRAMUSCULAR | Status: AC
  Administered 2018-12-11: 0.5 mL via SUBCUTANEOUS
  Filled 2018-12-10: qty 0.5

## 2018-12-10 MED ORDER — ONDANSETRON HCL 4 MG/2ML IJ SOLN: 4.0000 mg | INTRAMUSCULAR | Status: DC | PRN

## 2018-12-10 MED ORDER — LIDOCAINE HCL (PF) 1 % IJ SOLN
INTRAMUSCULAR | Status: DC | PRN
  Administered 2018-12-10: 6 mL via EPIDURAL

## 2018-12-10 MED ORDER — BENZOCAINE-MENTHOL 20-0.5 % EX AERO
1.0000 "application " | INHALATION_SPRAY | CUTANEOUS | Status: DC | PRN
  Filled 2018-12-10: qty 56

## 2018-12-10 MED ORDER — DIPHENHYDRAMINE HCL 25 MG PO CAPS: 25.0000 mg | ORAL_CAPSULE | Freq: Four times a day (QID) | ORAL | Status: DC | PRN

## 2018-12-10 MED ORDER — ZOLPIDEM TARTRATE 5 MG PO TABS: 5.0000 mg | ORAL_TABLET | Freq: Every evening | ORAL | Status: DC | PRN

## 2018-12-10 MED ORDER — FENTANYL-BUPIVACAINE-NACL 0.5-0.125-0.9 MG/250ML-% EP SOLN: 12.0000 mL/h | EPIDURAL | Status: DC | PRN

## 2018-12-10 MED ORDER — OXYCODONE HCL 5 MG PO TABS
5.0000 mg | ORAL_TABLET | Freq: Once | ORAL | Status: AC
  Administered 2018-12-10: 16:00:00 5 mg via ORAL
  Filled 2018-12-10: qty 1

## 2018-12-10 MED ORDER — TRANEXAMIC ACID-NACL 1000-0.7 MG/100ML-% IV SOLN
1000.0000 mg | INTRAVENOUS | Status: AC
  Administered 2018-12-10: 1000 mg via INTRAVENOUS
  Filled 2018-12-10: qty 100

## 2018-12-10 MED ORDER — DIPHENHYDRAMINE HCL 50 MG/ML IJ SOLN: 12.5000 mg | INTRAMUSCULAR | Status: DC | PRN

## 2018-12-10 MED ORDER — COCONUT OIL OIL: 1.0000 "application " | TOPICAL_OIL | Status: DC | PRN

## 2018-12-10 MED ORDER — WITCH HAZEL-GLYCERIN EX PADS: 1.0000 "application " | MEDICATED_PAD | CUTANEOUS | Status: DC | PRN

## 2018-12-10 MED ORDER — SENNOSIDES-DOCUSATE SODIUM 8.6-50 MG PO TABS
2.0000 | ORAL_TABLET | ORAL | Status: DC
  Administered 2018-12-11: 2 via ORAL
  Filled 2018-12-10: qty 2

## 2018-12-10 MED ORDER — ONDANSETRON HCL 4 MG PO TABS: 4.0000 mg | ORAL_TABLET | ORAL | Status: DC | PRN

## 2018-12-10 MED ORDER — ACETAMINOPHEN 325 MG PO TABS
650.0000 mg | ORAL_TABLET | ORAL | Status: DC | PRN
  Administered 2018-12-10 – 2018-12-11 (×5): 650 mg via ORAL
  Filled 2018-12-10 (×5): qty 2

## 2018-12-10 NOTE — Anesthesia Preprocedure Evaluation (Signed)
Anesthesia Evaluation  Patient identified by MRN, date of birth, ID band Patient awake    Reviewed: Allergy & Precautions, H&P , NPO status , Patient's Chart, lab work & pertinent test results, reviewed documented beta blocker date and time   Airway Mallampati: II  TM Distance: >3 FB Neck ROM: full    Dental no notable dental hx. (+) Teeth Intact   Pulmonary neg pulmonary ROS, Current Smoker,    Pulmonary exam normal breath sounds clear to auscultation       Cardiovascular hypertension, negative cardio ROS Normal cardiovascular exam Rhythm:regular Rate:Normal     Neuro/Psych negative neurological ROS  negative psych ROS   GI/Hepatic negative GI ROS, Neg liver ROS,   Endo/Other  negative endocrine ROS  Renal/GU negative Renal ROS  negative genitourinary   Musculoskeletal   Abdominal   Peds  Hematology negative hematology ROS (+)   Anesthesia Other Findings   Reproductive/Obstetrics (+) Pregnancy                             Anesthesia Physical Anesthesia Plan  ASA: II  Anesthesia Plan: Epidural   Post-op Pain Management:    Induction:   PONV Risk Score and Plan:   Airway Management Planned:   Additional Equipment:   Intra-op Plan:   Post-operative Plan:   Informed Consent: I have reviewed the patients History and Physical, chart, labs and discussed the procedure including the risks, benefits and alternatives for the proposed anesthesia with the patient or authorized representative who has indicated his/her understanding and acceptance.       Plan Discussed with:   Anesthesia Plan Comments:         Anesthesia Quick Evaluation

## 2018-12-10 NOTE — Progress Notes (Signed)
LABOR PROGRESS NOTE  Julia Torres is a 33 y.o. G3P1011 at [redacted]w[redacted]d  admitted for IOL for gHTN.   Subjective: Epidural now in place. Patient feeling well.   Objective: BP 121/77   Pulse 70   Temp 97.8 F (36.6 C) (Oral)   Resp 17   Ht 5\' 9"  (1.753 m)   Wt 105.2 kg   LMP 03/18/2018 (Approximate)   SpO2 98%   BMI 34.25 kg/m  or  Vitals:   12/10/18 0040 12/10/18 0044 12/10/18 0050 12/10/18 0055  BP:  120/78 122/78 121/77  Pulse:  69 81 70  Resp:  17 17 17   Temp:      TempSrc:      SpO2: 98%  98% 98%  Weight:      Height:       Dilation: 4.5 Effacement (%): 50 Cervical Position: Posterior Station: -1 Presentation: Vertex Exam by:: C.Laakea Pereira CNM FHT: baseline rate 135, moderate varibility, +acel, none variable decel Toco: q1-3 min   Labs: Lab Results  Component Value Date   WBC 13.1 (H) 12/09/2018   HGB 10.3 (L) 12/09/2018   HCT 32.6 (L) 12/09/2018   MCV 82.5 12/09/2018   PLT 345 12/09/2018    Patient Active Problem List   Diagnosis Date Noted  . Gestational hypertension, third trimester 12/09/2018  . Gestational hypertension 12/07/2018  . Supervision of other normal pregnancy, antepartum 06/19/2018  . Hx of preeclampsia, prior pregnancy, currently pregnant 06/19/2018    Assessment / Plan: 33 y.o. G3P1011 at [redacted]w[redacted]d here for IOL for gHTN. BPs normotensive.   Labor: Patient on pitocin at 18 mu/min. Titrate as appropriate. AROM performed with small amount of clear fluid at 0100.  Fetal Wellbeing:  Cat I Pain Control:  Epidural in place  Anticipated MOD:  NSVD   Phill Myron, D.O. OB Fellow  12/10/2018, 1:09 AM

## 2018-12-10 NOTE — Anesthesia Procedure Notes (Signed)
Epidural Patient location during procedure: OB Start time: 12/10/2018 12:37 AM End time: 12/10/2018 1:40 AM  Staffing Anesthesiologist: Janeece Riggers, MD  Preanesthetic Checklist Completed: patient identified, site marked, surgical consent, pre-op evaluation, timeout performed, IV checked, risks and benefits discussed and monitors and equipment checked  Epidural Patient position: sitting Prep: site prepped and draped and DuraPrep Patient monitoring: continuous pulse ox and blood pressure Approach: midline Location: L3-L4 Injection technique: LOR air  Needle:  Needle type: Tuohy  Needle gauge: 17 G Needle length: 9 cm and 9 Needle insertion depth: 9 cm Catheter type: closed end flexible Catheter size: 19 Gauge Catheter at skin depth: 14 cm Test dose: negative  Assessment Events: blood not aspirated, injection not painful, no injection resistance, negative IV test and no paresthesia

## 2018-12-10 NOTE — Anesthesia Postprocedure Evaluation (Signed)
Anesthesia Post Note  Patient: Deseray Daponte  Procedure(s) Performed: AN AD [place]     Patient location during evaluation: Mother Baby Anesthesia Type: Epidural Level of consciousness: awake and alert and oriented Pain management: satisfactory to patient Vital Signs Assessment: post-procedure vital signs reviewed and stable Respiratory status: respiratory function stable Cardiovascular status: stable Postop Assessment: no headache, no backache, epidural receding, patient able to bend at knees, no signs of nausea or vomiting and adequate PO intake Anesthetic complications: no    Last Vitals:  Vitals:   12/10/18 0616 12/10/18 0656  BP: 125/73 113/78  Pulse: (!) 59 (!) 56  Resp: 18 17  Temp:  36.7 C  SpO2:  100%    Last Pain:  Vitals:   12/10/18 0656  TempSrc: Oral  PainSc:    Pain Goal:                   Ashlynd Michna

## 2018-12-10 NOTE — Lactation Note (Signed)
This note was copied from a baby's chart. Lactation Consultation Note  Patient Name: Julia Torres RWERX'V Date: 12/10/2018 Reason for consult: Initial assessment;Early term 37-38.6wks  LC in to visit with P2 Mom of ET infant at 69 hrs old.  Baby was IOL for GHTN.  Mom is a smoker and THC user during pregnancy.  Mom breastfed 19 months with her first child (31 yrs old)  Mom sitting on side of bed eating her meal.  Mom states that baby latches well and has fed twice so far.  Baby is swaddled in crib sleeping.  Baby starting to move her mouth, and since it had been 2-3 hrs since she breastfed, offered to unwrap her and assist/assess her on the breast.  Mom just looked at [facility] and didn't say anything.    Talked about normal newborn behavior and possibility of baby becoming sleepy and not feeding as well.  Talked about pumping after baby breastfeeds to support her milk supply.  Encouraged breast massage and hand expression, and offered to review this with her.  Mom said abruptly "I know how to hand express" and looked away.  Recommended she place baby on her chest STS to encourage more cueing and more breastfeeds.  Parents taught about importance of baby having >8 feedings per 24 hrs.  Mom nodding.   Offered for Mom to call when baby latches for RN or LC to assist/assess with positioning and latching.  Mom didn't say anything.  Lactation brochure left with Mom.  Mom aware of IP and OP lactation support available to her, and encouraged he to call prn for assistance.  Consult Status Consult Status: Follow-up Date: 12/11/18 Follow-up type: In-patient    Broadus John 12/10/2018, 2:24 PM

## 2018-12-10 NOTE — Discharge Summary (Signed)
OB Discharge Summary     Patient Name: Julia MainlandSarah Ky DOB: [birth year] MRN: [id number]  Date of admission: 12/09/2018 Delivering MD: Arvilla MarketWALLACE, CATHERINE LAUREN   Date of discharge: 12/11/2018  Admitting diagnosis: Induction Intrauterine pregnancy: 759w2d     Secondary diagnosis:  Active Problems:   Hx of preeclampsia, prior pregnancy, currently pregnant   Gestational hypertension, third trimester   SVD (spontaneous vaginal delivery)  Additional problems: None     Discharge diagnosis: Term Pregnancy Delivered and Gestational Hypertension                                                                                                Post partum procedures:None  Augmentation: AROM, Pitocin, Cytotec and Foley Balloon  Complications: None  Hospital course:  Induction of Labor With Vaginal Delivery   33 y.o. yo G3P1011 at 1159w2d was admitted to the hospital 12/09/2018 for induction of labor.  Indication for induction: Gestational hypertension.  Patient had an uncomplicated labor course as follows: Membrane Rupture Time/Date: 1:04 AM ,12/10/2018   Intrapartum Procedures: Episiotomy: None [1]                                         Lacerations:  1st degree [2]  Patient had delivery of a Viable infant.  Information for the patient's newborn:  Erlene SentersSalinas, Girl Kristi [[id number]][[id number]]  Delivery Method: Vaginal, Spontaneous(Filed from Delivery Summary)    12/10/2018  Details of delivery can be found in separate delivery note.  Patient had a routine postpartum course. Patient is discharged home 12/11/18.  Physical exam  Vitals:   12/10/18 1230 12/10/18 1630 12/10/18 2042 12/11/18 0527  BP: 118/68 116/60 116/76 115/72  Pulse: (!) 54 (!) 55 (!) 57 (!) 56  Resp: 18 18 17 18   Temp: 98.4 F (36.9 C) 98.2 F (36.8 C) 98 F (36.7 C) 97.8 F (36.6 C)  TempSrc: Oral Oral Oral Oral  SpO2:   100% 100%  Weight:      Height:       General: alert, cooperative and no distress Lochia: appropriate Uterine  Fundus: firm Incision: N/A DVT Evaluation: No evidence of DVT seen on physical exam. Labs: Lab Results  Component Value Date   WBC 13.1 (H) 12/09/2018   HGB 10.3 (L) 12/09/2018   HCT 32.6 (L) 12/09/2018   MCV 82.5 12/09/2018   PLT 345 12/09/2018   CMP Latest Ref Rng & Units 06/19/2018  Glucose 65 - 99 mg/dL 010(U124(H)  BUN 6 - 20 mg/dL 6  Creatinine 7.250.57 - 3.661.00 mg/dL 4.40(H0.54(L)  Sodium 474134 - 259144 mmol/L 137  Potassium 3.5 - 5.2 mmol/L 3.9  Chloride 96 - 106 mmol/L 102  CO2 20 - 29 mmol/L 20  Calcium 8.7 - 10.2 mg/dL 8.9  Total Protein 6.0 - 8.5 g/dL 6.2  Total Bilirubin 0.0 - 1.2 mg/dL <5.6<0.2  Alkaline Phos 39 - 117 IU/L 67  AST 0 - 40 IU/L 8  ALT 0 - 32 IU/L 11    Discharge instruction: per After  Visit Summary and "Baby and Me Booklet".  After visit meds:  Allergies as of 12/11/2018   No Known Allergies     Medication List    TAKE these medications   ibuprofen 800 MG tablet Commonly known as: ADVIL Take 1 tablet (800 mg total) by mouth every 8 (eight) hours as needed.   PRENATAL VITAMIN PO Take by mouth.   TYLENOL PO Take by mouth.       Diet: low salt diet  Activity: Advance as tolerated. Pelvic rest for 6 weeks.   Outpatient follow up:4 weeks Follow up Appt: Future Appointments  Date Time Provider [place]  12/19/2018 10:30 AM [facility] None  01/24/2019 10:10 AM Tresea Mall, CNM [facility] None   Follow up Visit:No follow-ups on file.   Please schedule this patient for Postpartum visit in: 4 weeks with the following provider: Any provider For C/S patients schedule nurse incision check in weeks 2 weeks: no High risk pregnancy complicated by: gHTN --  Needs BP check in about 3 days  Delivery mode:  SVD Anticipated Birth Control:  Depo vs. OCPs  PP Procedures needed: BP check  Schedule Integrated [place] visit: no   Postpartum contraception: None  Newborn Data: Live born female   APGAR: 21, 31  Newborn Delivery   Birth  date/time: 12/10/2018 04:52:00 Delivery type: Vaginal, Spontaneous      Baby Feeding: Breast Disposition:home with mother   Marcille Buffy DNP, CNM  12/11/18  10:16 AM

## 2018-12-11 ENCOUNTER — Telehealth: Payer: Medicaid Other

## 2018-12-11 LAB — CULTURE, BETA STREP (GROUP B ONLY): Strep Gp B Culture: NEGATIVE

## 2018-12-11 MED ORDER — IBUPROFEN 800 MG PO TABS: 800.0000 mg | ORAL_TABLET | Freq: Three times a day (TID) | ORAL | 0 refills | Status: DC | PRN

## 2018-12-11 NOTE — Clinical Social Work Maternal (Addendum)
CLINICAL SOCIAL WORK MATERNAL/CHILD NOTE  Patient Details  Name: Julia Torres MRN: [id number] Date of Birth: 11/16/1985  Date:  12/11/2018  Clinical Social Worker Initiating Note:  Jaevion Goto, LCSW Date/Time: Initiated:  12/11/18/0855     Child's Name:  Julia Torres   Biological Parents:  Mother, Father   Need for Interpreter:  None   Reason for Referral:  Current Substance Use/Substance Use During Pregnancy    Address:  [address] [place] [address]    Phone number:  [phone] (home)     Additional phone number: none   Household Members/Support Persons (HM/SP):   Household Member/Support Person 3   HM/SP Name Relationship DOB or Age  HM/SP -1   Ace Torres (FOB)  FOB   11/10/1985  HM/SP -2   [name] (Sibling)  sibling   7 years old  HM/SP -3 [name] (MOB) MOB  31/04/1988  HM/SP -4        HM/SP -5        HM/SP -6        HM/SP -7        HM/SP -8          Natural Supports (not living in the home):  Parent, Immediate Family   Professional Supports: Therapist(Jamie with [facility].)   Employment: Unemployed   Type of Work: none   Education:  Some College   Homebound arranged:  n/a  Financial Resources:  Medicaid   Other Resources:  Food Stamps    Cultural/Religious Considerations Which May Impact Care:  none reported.   Strengths:  Ability to meet basic needs , Compliance with medical plan    Psychotropic Medications:    none.     Pediatrician:     not chosen yet  Pediatrician List:   [place]    [place]    [place]    [place]    [place]    [place]      Pediatrician Fax Number:    Risk Factors/Current Problems:  Substance Use    Cognitive State:  Alert , Able to Concentrate    Mood/Affect:  Calm , Comfortable , Relaxed    CSW Assessment: CSW consulted as MOB has a history of depression and anxiety as well as THC use while pregnant. CSW spoke with MOB and FOB at bedside to  address further needs.   CSW congratulated MOB and FOB on the birth of infant. CSW advised MOB of the reason for the visit. MOB expressed that she has situational anxiety and depression. MOB unsure of when this began for her but reports that she has been in therapy with Jamie for this., .MOB reported that she has been speaking with Jamie regarding medication needs for aniety and depression however MOB currently not on anything at this time. MOB reported that while pregnant anxiety and depression were both well managed aside from some every so often. MOB reports that she has support from FOB and other family members. CSW asked MOB about safety and living arrangement. MOB reports that she is not feeling SI or HI. MOB reports that when she leaves the hospital she will be going to stay with FOB in [place]. CSW received address of [address], [place], [address].  CSW inquired from MOB on substance use while pregnant. MOB reports that she used THC while pregnant with last use being in April. MOB was advised of the hospital drug screen policy at this time. MOB   and RN both expressed that infant has urine since birth however cotton balls have been soiled with poop as well. CSW advised MOB that this is okay-RN is still able to get urine cotton balls. CSW encouraged MOB to leave cotton balls in diaper so that urine can be collected. CSW spoke with RN and was advised that infant is now 28 hours old (past the point of collecting urine). CSW advised MOB and FOB that CSW would follow cord since urine is no longer being collected. MOB and FOB understanding of this. CSW did advised both that if infants CDS is positive for any substance bot given here or prescribed by and MD then CPS report would need to be made.    CSW provided MOB PPD and SIDS education. MOB and FOB report that they have all needed items to care for infant with no needs at this time.   CSW will continue to monitor infants CDS for CPS  report as needed.   CSW Plan/Description:  No Further Intervention Required/No Barriers to Discharge, Sudden Infant Death Syndrome (SIDS) Education, Perinatal Mood and Anxiety Disorder (PMADs) Education, Hospital Drug Screen Policy Information, CSW Will Continue to Monitor Umbilical Cord Tissue Drug Screen Results and Make Report if Warranted    Mann Skaggs S Vola Beneke, LCSWA 12/11/2018, 9:47 AM  

## 2018-12-11 NOTE — Discharge Instructions (Signed)

## 2018-12-11 NOTE — Lactation Note (Signed)
This note was copied from a baby's chart. Lactation Consultation Note  Patient Name: Julia Torres IRSWN'I Date: 12/11/2018 Reason for consult: Follow-up assessment;Early term 37-38.6wks  LC in to visit with P2 Mom of ET infant on day of discharge.  Baby 67 hrs old and at 5% weight loss, with good output.   Mom denies having any difficulty latching.  Baby started cueing, and Mom easily placed baby in football hold.  Added pillow under baby, and support behind Mom's back.  Baby's latch was deep and regular sucks/swallows identified for parents.  Mom shown how to compress breast during sucking to increase milk transfer.    Encouraged STS and feeding baby often with any cue Mom doesn't have a DEBP at home.  Hand pump given with instructions on use and care.  Engorgement prevention and treatment reviewed.  Mom aware of OP lactation support available to her.  Encouraged to call prn.   Feeding Feeding Type: Breast Fed  LATCH Score Latch: Grasps breast easily, tongue down, lips flanged, rhythmical sucking.  Audible Swallowing: Spontaneous and intermittent  Type of Nipple: Everted at rest and after stimulation  Comfort (Breast/Nipple): Soft / non-tender  Hold (Positioning): Assistance needed to correctly position infant at breast and maintain latch.  LATCH Score: 9  Interventions Interventions: Breast feeding basics reviewed;Assisted with latch;Skin to skin;Breast massage;Breast compression;Adjust position;Support pillows;Position options;Hand pump  Lactation Tools Discussed/Used Tools: Pump Breast pump type: Manual   Consult Status Consult Status: Complete Date: 12/11/18 Follow-up type: Call as needed    Broadus John 12/11/2018, 12:19 PM

## 2018-12-11 NOTE — Progress Notes (Signed)
CSW made aware that MOB very upset with information that has been given to her regarding drug screen policy. CSW spoke with MOB and Fob at bedside to address further concerns FOB reported that MOB is worried about going to jail due to [facility] Use. CSW advised MOB and FOB that CSW is unsure of the policy in [facility] regarding infants and substance use. CSW did advised MOB and FOB that if CSW needed to make a CPS report CSW would be making it to [place]. MOB and FOB still presented as on edge and nervous.   CSW offered to reach out to [place] in [place]. CSW spoke with CPS intake and was advised that if infant is positive for [facility] then that would go to their DSS department if infant is negative then report would go to a community agency which MOB participates in voluntarily. MOB appeared to be eased a little but still worried. Per CPS intake in [place], if THC use did not occur in [place] then the report (if needed) would go to the community agency for further follow up. CSW left room once this information was given to MOB and FOB.      Julia Torres, MSW, LCSW [facility] [place] [phone]

## 2018-12-14 ENCOUNTER — Telehealth: Payer: Medicaid Other | Admitting: Obstetrics and Gynecology

## 2018-12-14 ENCOUNTER — Ambulatory Visit: Payer: Medicaid Other | Admitting: *Deleted

## 2018-12-14 ENCOUNTER — Other Ambulatory Visit: Payer: Self-pay

## 2018-12-14 VITALS — BP 120/77 | HR 88

## 2018-12-14 DIAGNOSIS — O165 Unspecified maternal hypertension, complicating the puerperium: Secondary | ICD-10-CM

## 2018-12-14 NOTE — Progress Notes (Signed)
      Virtual Visit via Telephone Note  I connected with Julia Torres on 12/14/18 at 10:10 AM EDT by telephone and verified that I am speaking with the correct person using two identifiers.  Location: Patient: Julia Torres MRN [id number] Provider: Derl Barrow, RN   I discussed the limitations, risks, security and privacy concerns of performing an evaluation and management service by telephone and the availability of in person appointments. I also discussed with the patient that there may be a patient responsible charge related to this service. The patient expressed understanding and agreed to proceed.  History of Present Illness: Subjective:  Julia Torres is a 33 y.o. female telephone call for BP check.   Hypertension ROS: no TIA's, no chest pain on exertion, no dyspnea on exertion, no orthostatic dizziness or lightheadedness and no palpitations.    Observations/Objective: BP 120/77   Pulse 88   Appearance oriented to person, place, and time and non face to face call.  Assessment and Plan: Blood Pressure well controlled, stable, improved and asymptomatic.  Current treatment plan is effective, no change in therapy..  Follow Up Instructions:  I discussed the assessment and treatment plan with the patient. The patient was provided an opportunity to ask questions and all were answered. The patient agreed with the plan and demonstrated an understanding of the instructions.   The patient was advised to call back or seek an in-person evaluation if the symptoms worsen or if the condition fails to improve as anticipated.  I provided 5 minutes of non-face-to-face time during this encounter.   Derl Barrow, RN

## 2018-12-19 ENCOUNTER — Ambulatory Visit: Payer: Medicaid Other

## 2018-12-21 ENCOUNTER — Telehealth: Payer: Medicaid Other | Admitting: Obstetrics and Gynecology

## 2018-12-27 ENCOUNTER — Encounter: Payer: Medicaid Other | Admitting: Obstetrics and Gynecology

## 2019-01-24 ENCOUNTER — Telehealth (INDEPENDENT_AMBULATORY_CARE_PROVIDER_SITE_OTHER): Payer: Medicaid Other | Admitting: Advanced Practice Midwife

## 2019-01-24 ENCOUNTER — Encounter: Payer: Self-pay | Admitting: Advanced Practice Midwife

## 2019-01-24 ENCOUNTER — Other Ambulatory Visit: Payer: Self-pay

## 2019-01-24 MED ORDER — NORETHINDRONE 0.35 MG PO TABS: 1.0000 | ORAL_TABLET | Freq: Every day | ORAL | 11 refills | Status: DC

## 2019-01-24 NOTE — Progress Notes (Signed)
    TELEHEALTH VIRTUAL POSTPARTUM VISIT ENCOUNTER NOTE  I connected with Julia Torres  on 01/24/19 at  3:10 PM EDT by telephone at home and verified that I am speaking with the correct person using two identifiers.   I discussed the limitations, risks, security and privacy concerns of performing an evaluation and management service by telephone and the availability of in person appointments. I also discussed with the patient that there may be a patient responsible charge related to this service. The patient expressed understanding and agreed to proceed.  Appointment Date: 01/24/2019  OBGYN Clinic: [facility]   Chief Complaint:  Postpartum Visit  History of Present Illness: Julia Torres is a 33 y.o. biracial [phone] (No LMP recorded.), seen for the above chief complaint. Her past medical history is significant for GHTN   She is s/p normal spontaneous vaginal delivery on 12/10/2018 at 37.2 weeks; she was discharged to home on PPD#2. Pregnancy complicated by GHTN. Baby is doing well.  Complains of: None   Vaginal bleeding or discharge: No  Mode of feeding infant: Bottle Intercourse: No  Contraception: oral contraceptives (estrogen/progesterone) PP depression s/s: No .  Any bowel or bladder issues: No  Pap smear: no abnormalities (date: 07/2018)  Review of Systems: Her 12 point review of systems is negative or as noted in the History of Present Illness.  Patient Active Problem List   Diagnosis Date Noted  . SVD (spontaneous vaginal delivery) 12/10/2018  . Gestational hypertension, third trimester 12/09/2018  . Gestational hypertension 12/07/2018  . Supervision of other normal pregnancy, antepartum 06/19/2018  . Hx of preeclampsia, prior pregnancy, currently pregnant 06/19/2018    Medications Carsyn Boster had no medications administered during this visit. Current Outpatient Medications  Medication Sig Dispense Refill  . Prenatal Vit-Fe Fumarate-FA (PRENATAL VITAMIN PO) Take by  mouth.    . Acetaminophen (TYLENOL PO) Take by mouth.    Marland Kitchen ibuprofen (ADVIL) 800 MG tablet Take 1 tablet (800 mg total) by mouth every 8 (eight) hours as needed. (Patient not taking: Reported on 01/24/2019) 30 tablet 0   No current facility-administered medications for this visit.     Allergies Patient has no known allergies.  Physical Exam:  General:  Alert, oriented and cooperative.   Mental Status: Normal mood and affect perceived. Normal judgment and thought content.  Rest of physical exam deferred due to type of encounter  PP Depression Screening:    EDPS: 13  Assessment:Patient is a 33 y.o. [id number] who is 6 weeks postpartum from a normal spontaneous vaginal delivery.  She is doing well.   Plan: 1. Postpartum care and examination - Plans Micronor for birth control, will call or message [place] when she weans for OCPs - EDPS is 6, and patient would like to see Roselyn Reef.   2. Gestational Hypertension  - Will check BP at target and send [place] a message with the results   RTC: PRN or 1 year   I discussed the assessment and treatment plan with the patient. The patient was provided an opportunity to ask questions and all were answered. The patient agreed with the plan and demonstrated an understanding of the instructions.   The patient was advised to call back or seek an in-person evaluation/go to the ED for any concerning postpartum symptoms.  I provided 15 minutes of non-face-to-face time during this encounter.  Marcille Buffy DNP, CNM  01/24/19  3:23 PM  [facility]

## 2019-01-24 NOTE — Progress Notes (Deleted)
     MY CHART VIDEO POSTPARTUM VISIT ENCOUNTER NOTE  I connected with Julia Torres on 01/24/19 at  3:10 PM EDT by My Chart video at home and verified that I am speaking with the correct person using two identifiers.   I discussed the limitations, risks, security and privacy concerns of performing an evaluation and management service by My Chart video and the availability of in person appointments. I also discussed with the patient that there may be a patient responsible charge related to this service. The patient expressed understanding and agreed to proceed.   History:  Julia Torres is a 33 y.o. G67P2012 female who presents for a postpartum visit. She is 5 weeks postpartum following a vaginal/vacuum-assisted/cesarean delivery. I have fully reviewed the prenatal and intrapartum course. The delivery was at 64 gestational weeks.  Anesthesia: epidural. Postpartum course has been ***. Baby's course has been ***. Baby is feeding by breastfeeding and formula. Bleeding small amount off and on. Bowel function is normal. Bladder function is normal. Patient is not sexually active. Contraception method choice is pills. Postpartum depression screening: negative: Score 6.        Past Medical History:  Diagnosis Date  . Anxiety   . Depression   . Pregnancy induced hypertension    Past Surgical History:  Procedure Laterality Date  . NO PAST SURGERIES     The following portions of the patient's history were reviewed and updated as appropriate: allergies, current medications, past family history, past medical history, past social history, past surgical history and problem list.   Health Maintenance:  Normal pap and negative HRHPV on ***.  Normal mammogram on ***.   Review of Systems:  Pertinent items noted in HPI and remainder of comprehensive ROS otherwise negative.  Physical Exam:  Physical exam deferred due to nature of the encounter  Labs and Imaging No results found for this or any previous  visit (from the past 336 hour(s)). No results found.    Assessment and Plan:   1. Postpartum care and examination ***       I discussed the assessment and treatment plan with the patient. The patient was provided an opportunity to ask questions and all were answered. The patient agreed with the plan and demonstrated an understanding of the instructions.   The patient was advised to call back or seek an in-person evaluation/go to the ED if the symptoms worsen or if the condition fails to improve as anticipated.  I provided *** minutes of non-face-to-face time during this encounter. There was *** minutes of chart review time spent prior to this encounter. Total time spent = *** minutes.    Derl Barrow, RN [facility], [place]

## 2019-02-01 NOTE — BH Specialist Note (Signed)
Pt did not arrive to video visit and did not answer the phone; Left HIPPA-compliant message to call back Roselyn Reef from [facility] at [phone], and left MyChart message for patient.  Integrated Behavioral Health via Telemedicine Video Visit  02/01/2019 Leidi Astle [phone]   [place]

## 2019-02-02 ENCOUNTER — Ambulatory Visit: Payer: Medicaid Other | Admitting: Clinical

## 2019-02-02 ENCOUNTER — Other Ambulatory Visit: Payer: Self-pay

## 2019-02-02 ENCOUNTER — Encounter: Payer: Self-pay | Admitting: Obstetrics and Gynecology

## 2019-02-02 DIAGNOSIS — Z5329 Procedure and treatment not carried out because of patient's decision for other reasons: Secondary | ICD-10-CM

## 2019-02-02 DIAGNOSIS — Z91199 Patient's noncompliance with other medical treatment and regimen due to unspecified reason: Secondary | ICD-10-CM

## 2020-02-23 IMAGING — US US MFM OB DETAIL +14 WK
1 series · 13 of 28 positions shown · non-contrast
Comparison: none

[Series 1: us mfm ob detail +14 wk · 13 of 65 slices shown]
[im 3/65]
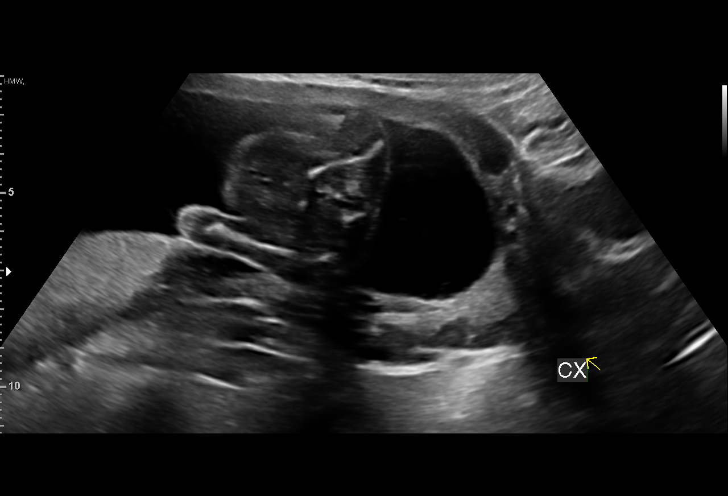
[im 8/65]
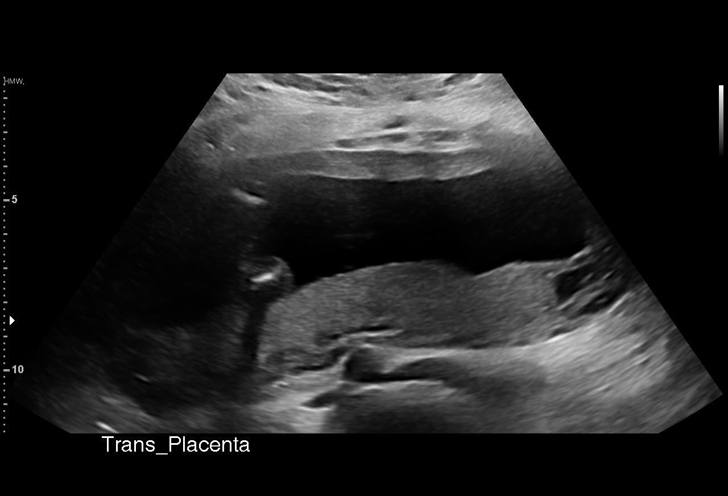
[im 12/65]
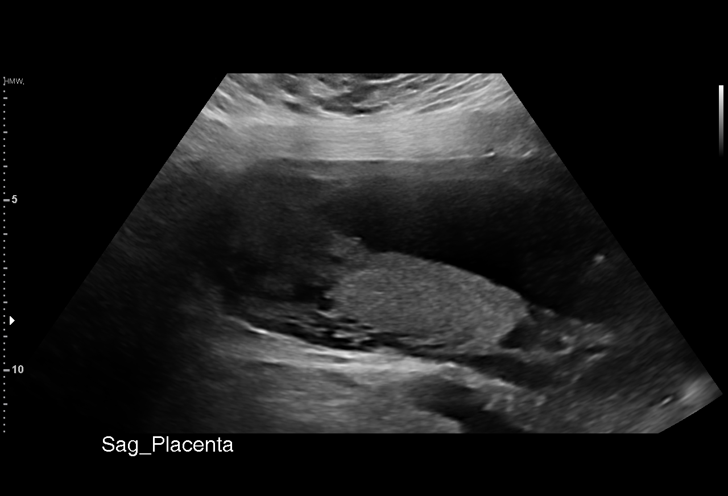
[im 17/65]
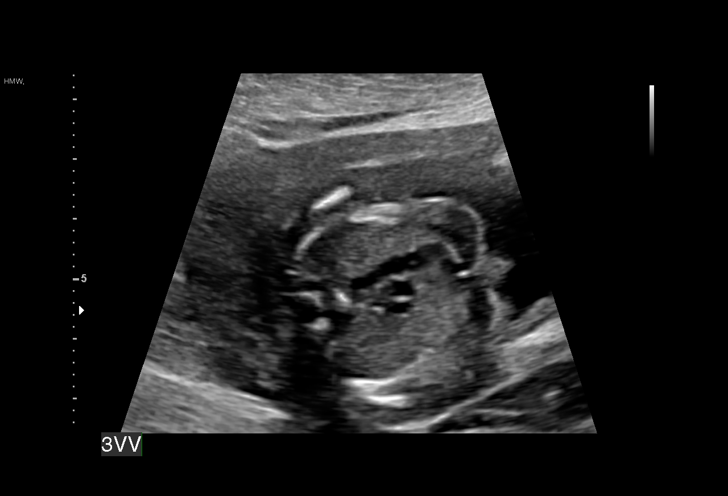
[im 22/65]
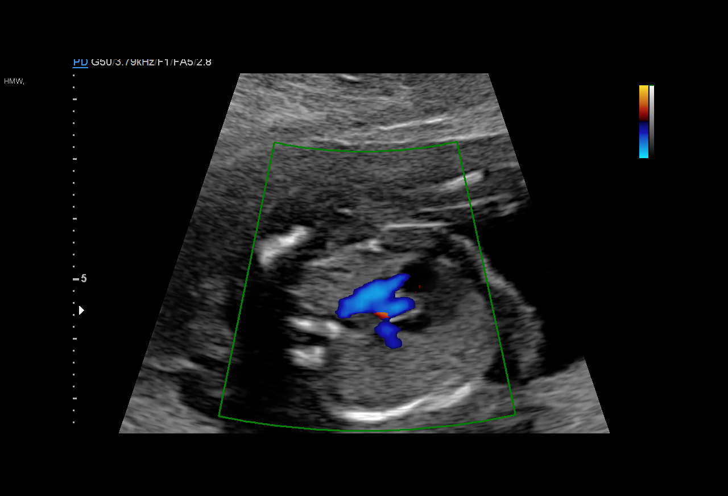
[im 27/65]
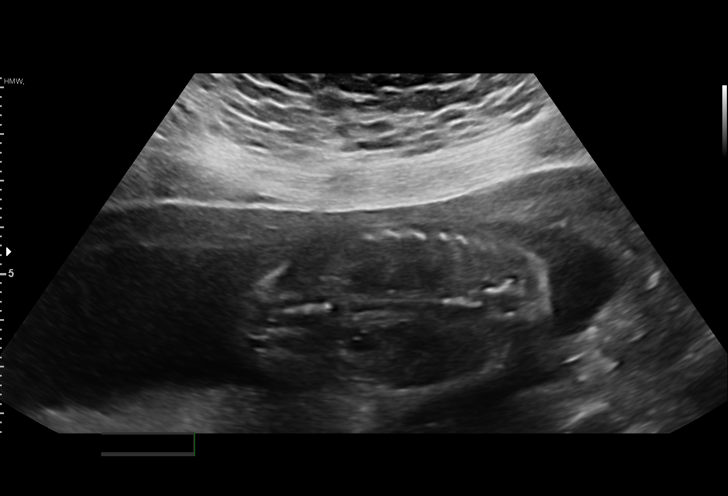
[im 34/65]
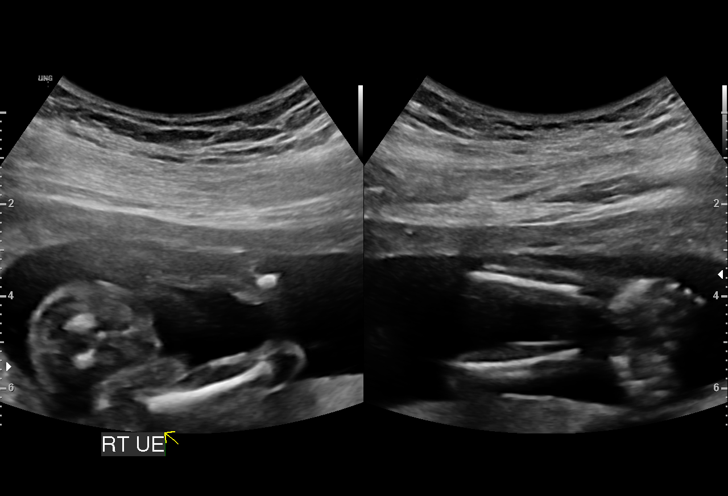
[im 38/65]
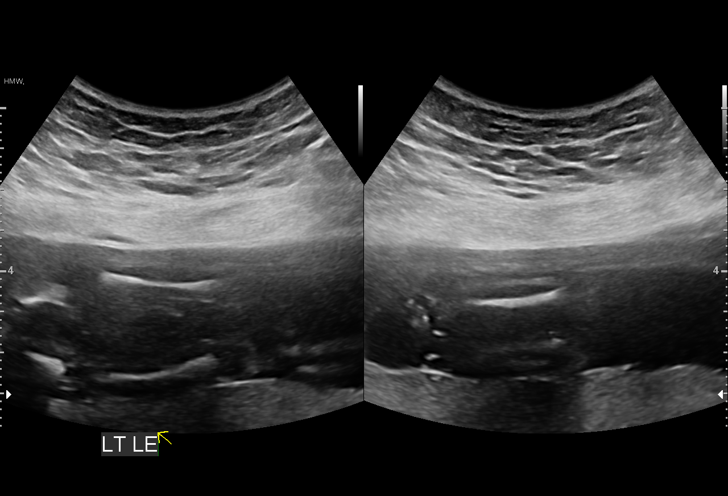
[im 43/65]
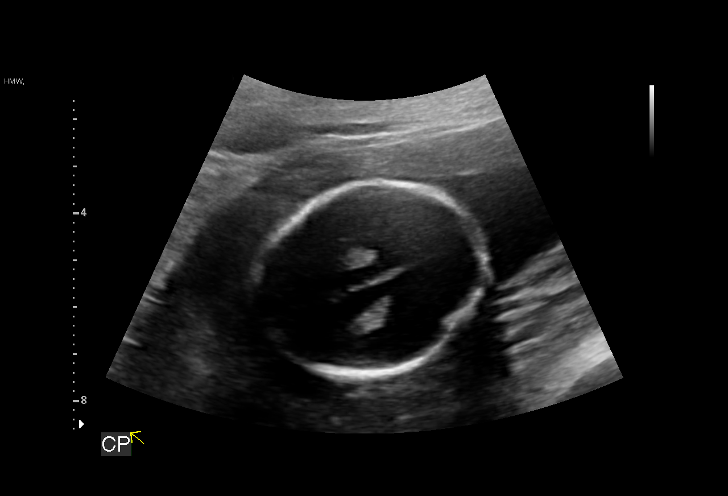
[im 48/65]
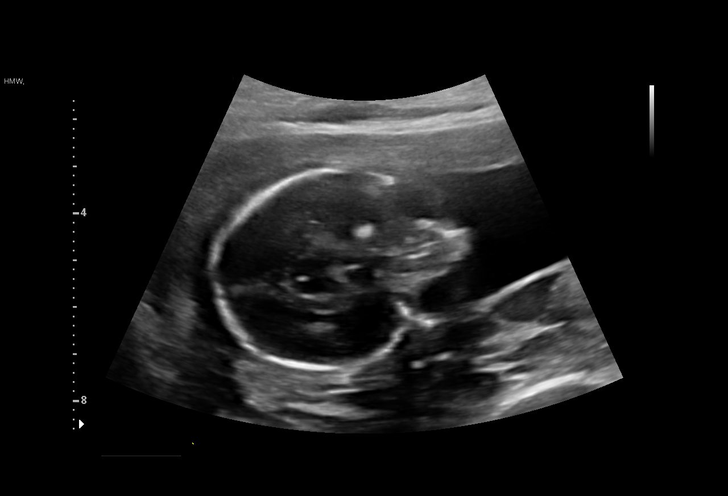
[im 53/65]
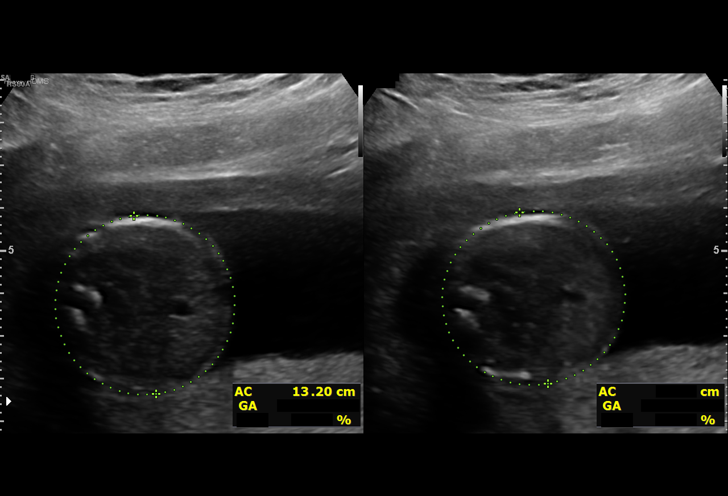
[im 57/65]
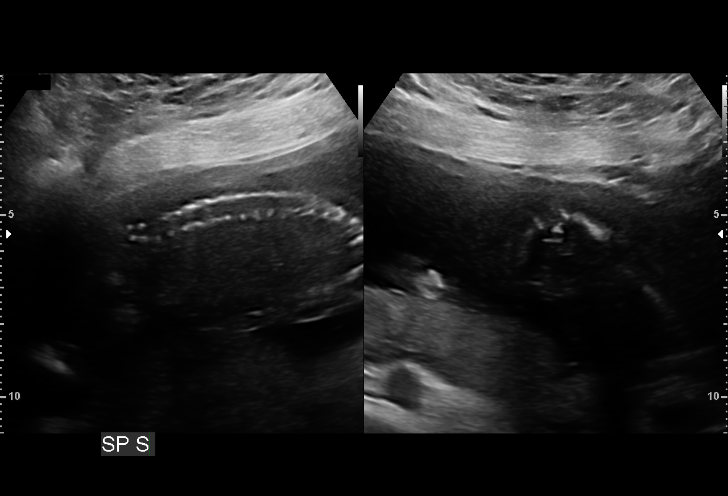
[im 62/65]
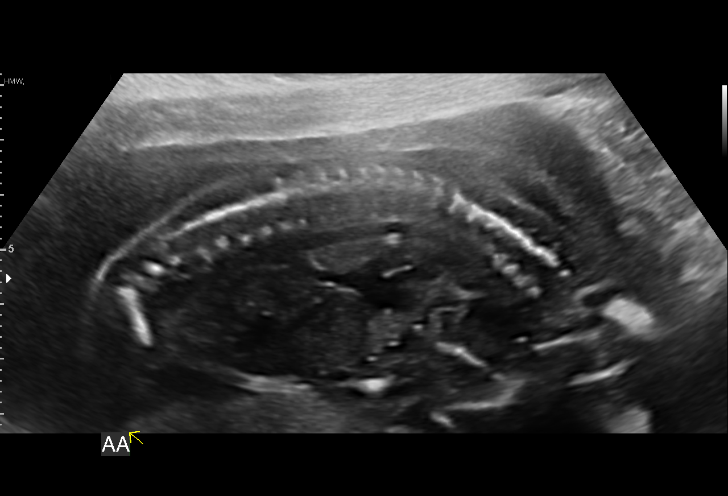

[13 of 28 positions shown; findings below may reference images not displayed]

Healthcare

 ----------------------------------------------------------------------

 ----------------------------------------------------------------------
Indications

  Obesity complicating pregnancy, second
  trimester (pregravid BMI 33.06)
  Abnormal biochemical screen (Panorama-
  atypical finding) (QUAD pending)
  Poor obstetric history: Previous preeclampsia /
  eclampsia/gestational HTN
  Encounter for antenatal screening for
  malformations
  Smoking complicating pregnancy, second
  trimester
  18 weeks gestation of pregnancy
 ----------------------------------------------------------------------
Fetal Evaluation

 Num Of Fetuses:          1
 Fetal Heart Rate(bpm):   150
 Cardiac Activity:        Observed
 Presentation:            Cephalic
 Placenta:                Posterior
 P. Cord Insertion:       Visualized, central

 Amniotic Fluid
 AFI FV:      Within normal limits

                             Largest Pocket(cm)

Biometry

 BPD:      41.1   mm     G. Age:  18w 3d         52  %    CI:         74.11  %    70 - 86
                                                          FL/HC:       20.3  %    15.8 - 18
 HC:      151.6   mm     G. Age:  18w 1d         31  %    HC/AC:       1.15       1.07 -
 AC:        132   mm     G. Age:  18w 5d         56  %    FL/BPD:      74.7  %
 FL:       30.7   mm     G. Age:  19w 4d         80  %    FL/AC:       23.3  %    20 - 24
 HUM:      26.4   mm     G. Age:  18w 2d         51  %
 CER:      18.4   mm     G. Age:  18w 1d         42  %
 NFT:        2.8  mm

 CM:         4.2  mm

 Est. FW:     267   gm      0 lb 9 oz     55  %
OB History

 Gravidity:     2         Term:  1          Prem:  0        SAB:   0
 TOP:           0       Ectopic: 0         Living: 1
Gestational Age

 LMP:            19w 2d       Date:  03/18/18                   EDD:  12/23/18
 U/S Today:      18w 5d                                         EDD:  12/27/18
 Best:           18w 3d    Det. By:  Early Ultrasound           EDD:  12/29/18
                                     (06/29/18)
Anatomy

 Cranium:                Appears normal         Aortic Arch:            Appears normal
 Cavum:                  Appears normal         Ductal Arch:            Not well visualized
 Ventricles:             Appears normal         Diaphragm:              Appears normal
 Choroid Plexus:         Appears normal         Stomach:                Appears normal, left
                                                                        sided
 Cerebellum:             Appears normal         Abdomen:                Appears normal
 Posterior Fossa:        Appears normal         Abdominal Wall:         Appears nml (cord
                                                                        insert, abd wall)
 Nuchal Fold:            Appears normal         Cord Vessels:           Appears normal (3
                                                                        vessel cord)
 Face:                   Orbits nl; profile not Kidneys:                Appear normal
                         well visualized
 Lips:                   Not well visualized    Bladder:                Appears normal
 Thoracic:               Appears normal         Spine:                  Appears normal
 Heart:                  Not well visualized    Upper Extremities:      Appears normal
 RVOT:                   Appears normal         Lower Extremities:      Appears normal
 LVOT:                   Appears normal

 Other:   Heels visualized. Hands not well visualized.
Cervix Uterus Adnexa

 Cervix
 Length:             3.8  cm.
 Normal appearance by transabdominal scan.

 Uterus
 No abnormality visualized.

 Left Ovary
 No adnexal mass visualized.

 Right Ovary
 No adnexal mass visualized.
 Cul De Sac
 No free fluid seen.

 Adnexa
 No abnormality visualized.
Impression

 Normal interval growth.  No ultrasonic evidence of structural
 fetal anomalies.
 Suboptimal views of the fetal anatomy was seen secondary to
 fetal position.
Recommendations

 Follow up anatomy in 4 weeks.

## 2020-03-22 IMAGING — US US MFM OB FOLLOW UP
1 series · 13 of 28 positions shown · non-contrast
Comparison: none

[Series 1: us mfm ob follow up · 13 of 41 slices shown]
[im 2/41]
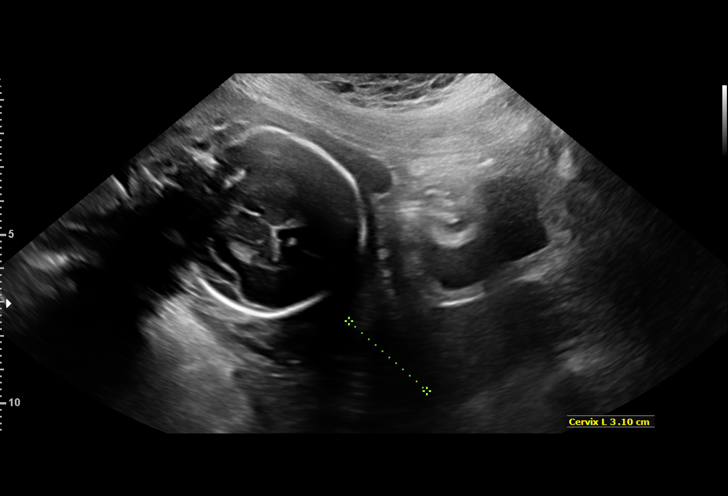
[im 5/41]
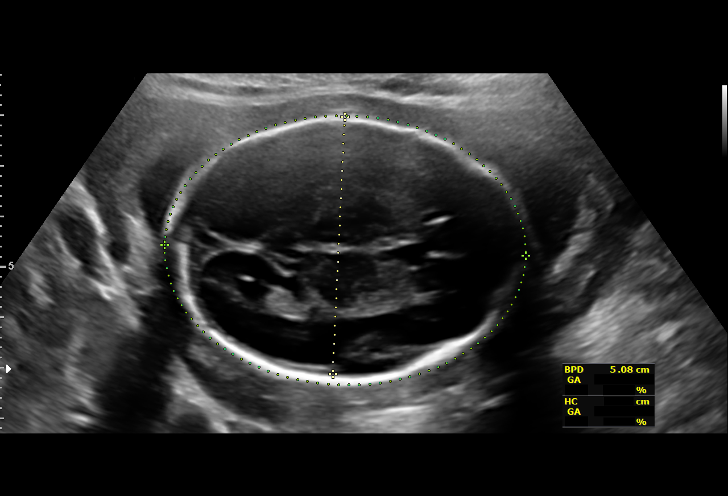
[im 8/41]
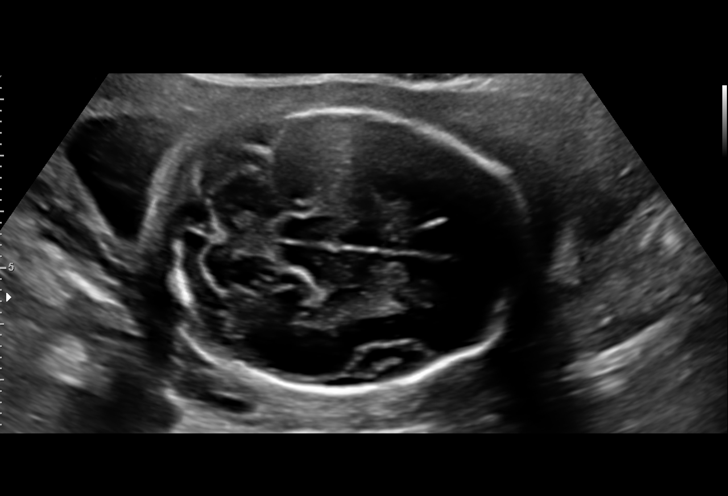
[im 11/41]
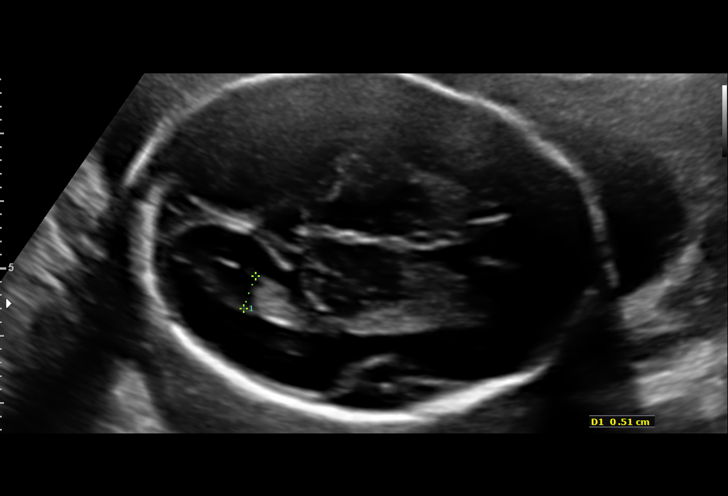
[im 14/41]
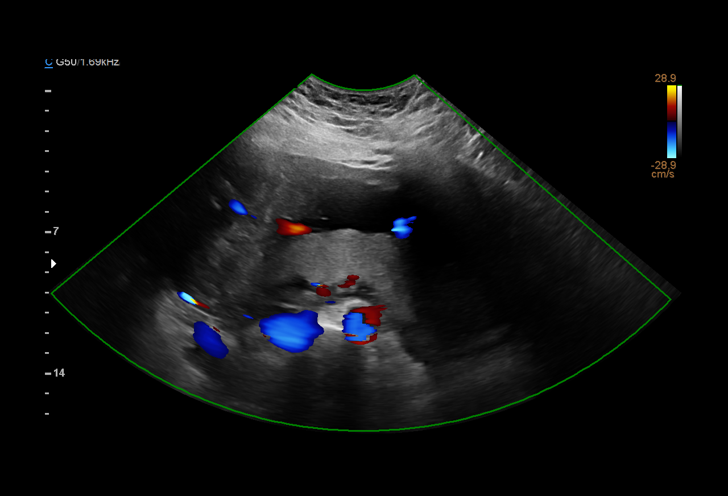
[im 17/41]
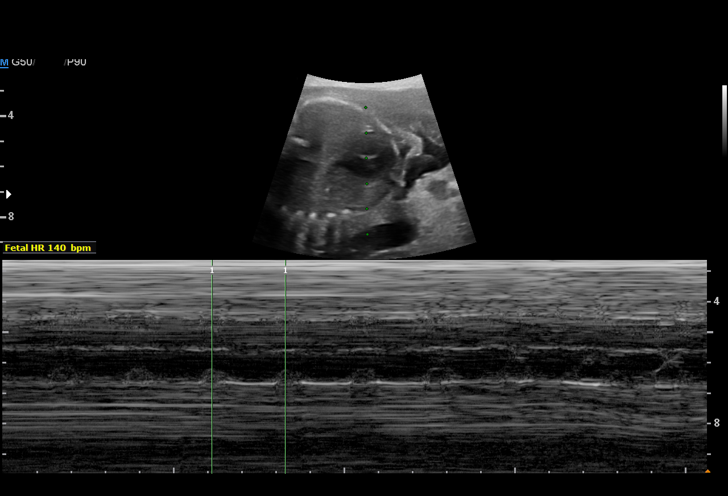
[im 21/41]
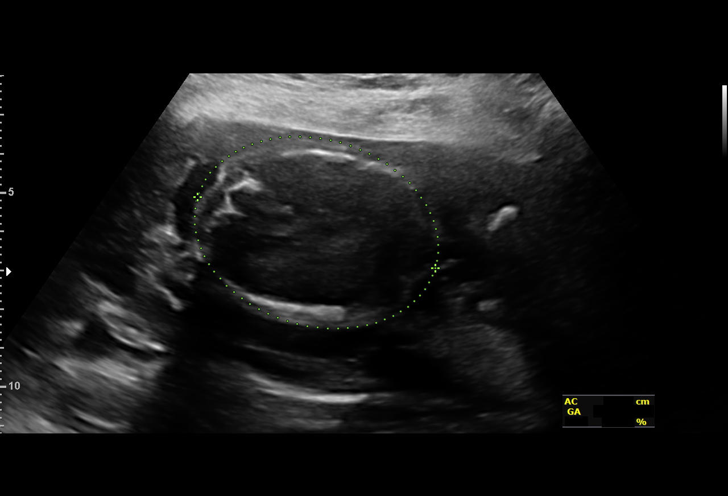
[im 24/41]
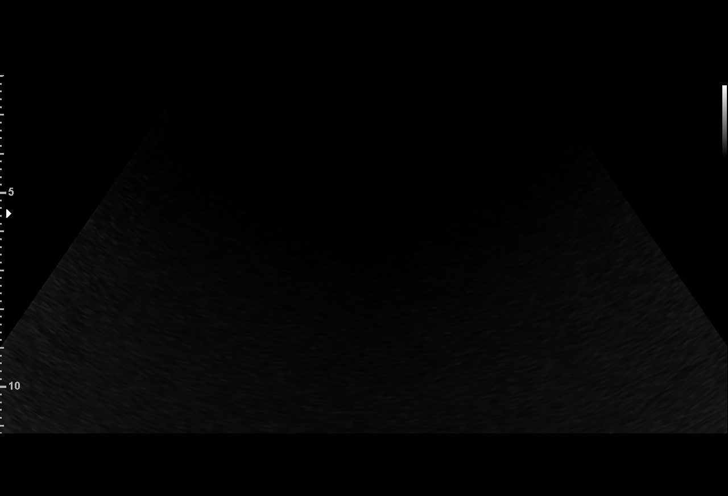
[im 27/41]
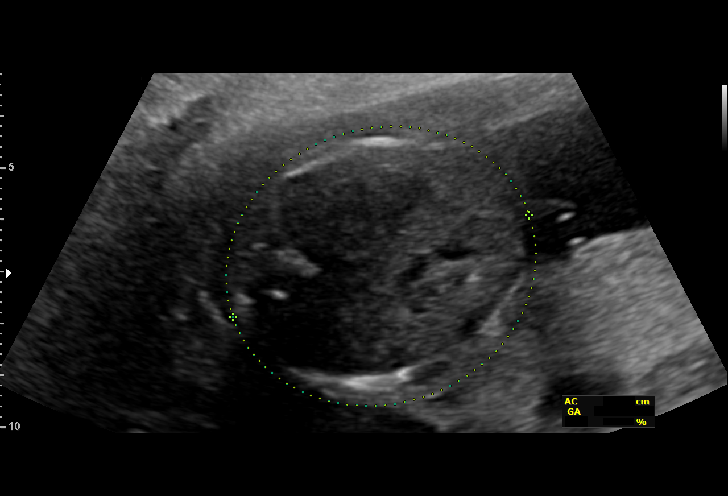
[im 30/41]
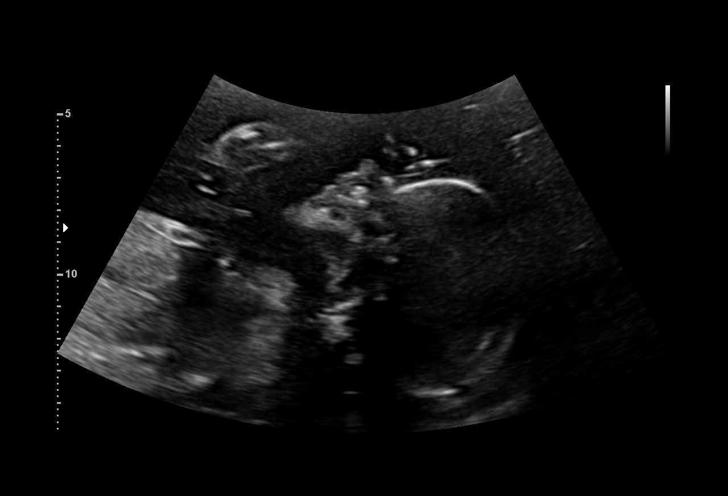
[im 33/41]
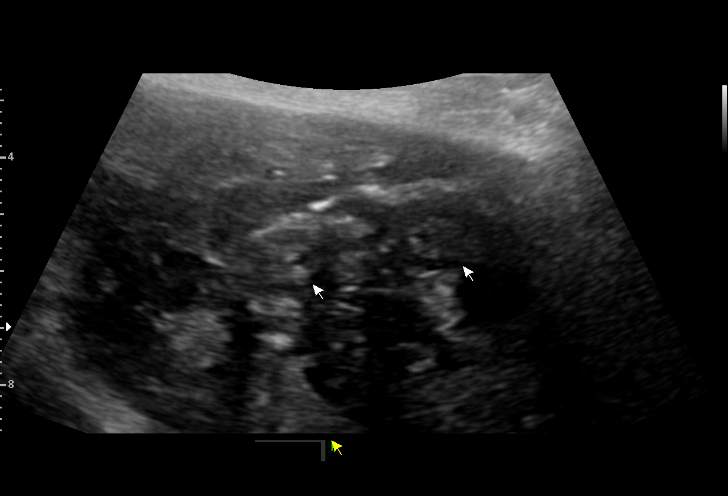
[im 36/41]
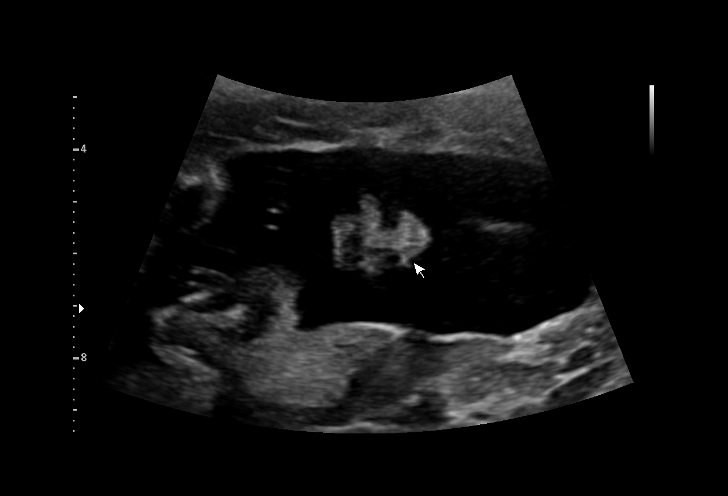
[im 39/41]
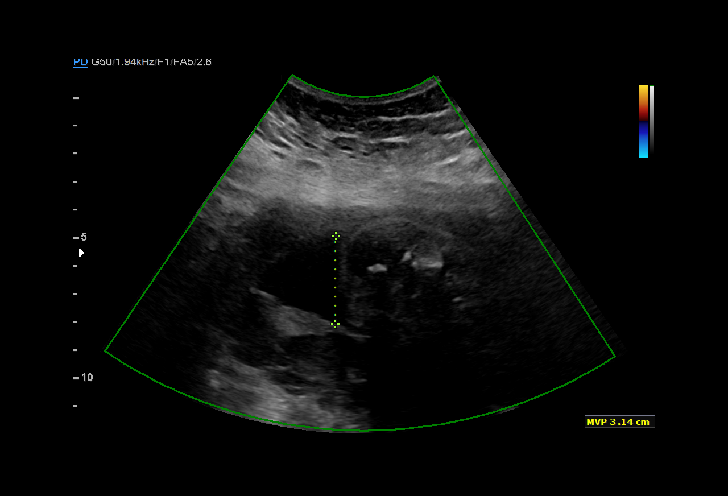

[13 of 28 positions shown; findings below may reference images not displayed]

[facility]
 ----------------------------------------------------------------------

 ----------------------------------------------------------------------
Indications

  Abnormal biochemical screen -
  Amniocentesis (46, XX. interstitial deletion of
  chromasome [REDACTED] GXBEE.E)
  Encounter for other antenatal screening        PIII.9
  follow-up
  Obesity complicating pregnancy, second
  trimester (pregravid BMI 33.06)
  Poor obstetric history: Previous
  preeclampsia / eclampsia/gestational HTN
  Smoking complicating pregnancy, second
  trimester
  22 weeks gestation of pregnancy
 ----------------------------------------------------------------------
Fetal Evaluation

 Num Of Fetuses:         1
 Fetal Heart Rate(bpm):  140
 Cardiac Activity:       Observed
 Presentation:           Cephalic
 Placenta:               Posterior
 P. Cord Insertion:      Previously Visualized

 Amniotic Fluid
 AFI FV:      Within normal limits

                             Largest Pocket(cm)

Biometry

 BPD:      52.2  mm     G. Age:  21w 6d         24  %    CI:        71.99   %    70 - 86
                                                         FL/HC:      19.6   %    18.4 -
 HC:      195.8  mm     G. Age:  21w 5d         16  %    HC/AC:      1.07        1.06 -
 AC:      182.3  mm     G. Age:  23w 0d         62  %    FL/BPD:     73.4   %    71 - 87
 FL:       38.3  mm     G. Age:  22w 2d         34  %    FL/AC:      21.0   %    20 - 24
 HUM:      36.3  mm     G. Age:  22w 5d         50  %
 CER:      24.5  mm     G. Age:  22w 4d         51  %
 CM:        3.8  mm

 Est. FW:     516  gm      1 lb 2 oz     52  %
OB History

 Gravidity:    2         Term:   1        Prem:   0        SAB:   0
 TOP:          0       Ectopic:  0        Living: 1
Gestational Age

 LMP:           23w 2d        Date:  03/18/18                 EDD:   12/23/18
 U/S Today:     22w 2d                                        EDD:   12/30/18
 Best:          22w 3d     Det. By:  Early Ultrasound         EDD:   12/29/18
                                     (06/29/18)
Anatomy

 Cranium:               Appears normal         Aortic Arch:            Previously seen
 Cavum:                 Appears normal         Ductal Arch:            Appears normal
 Ventricles:            Appears normal         Diaphragm:              Appears normal
 Choroid Plexus:        Previously seen        Stomach:                Appears normal, left
                                                                       sided
 Cerebellum:            Previously seen        Abdomen:                Appears normal
 Posterior Fossa:       Previously seen        Abdominal Wall:         Previously seen
 Nuchal Fold:           Previously seen        Cord Vessels:           Previously seen
 Face:                  Profile nl; orbits     Kidneys:                Appear normal
                        prev seen
 Lips:                  Appears normal         Bladder:                Appears normal
 Thoracic:              Appears normal         Spine:                  Previously seen
 Heart:                 Not well visualized    Upper Extremities:      Previously seen
 RVOT:                  Previously seen        Lower Extremities:      Previously seen
 LVOT:                  Previously seen

 Other:  Heels prev visualized. 5th digit visualized.
Cervix Uterus Adnexa

 Cervix
 Length:            3.1  cm.
 Normal appearance by transabdominal scan.
Impression

 Normal interval growth.
 s/p amniocentesis with a mircoarray result of a deletion of
 unknown clinical significance
 Amniocentesis performed for "atypical result"
 Suboptimal view of the fetal heart obtained today.
Recommendations

 Follow up growth in 4-6 weeks.

## 2021-07-09 ENCOUNTER — Other Ambulatory Visit: Payer: Self-pay

## 2021-07-09 ENCOUNTER — Ambulatory Visit: Payer: Self-pay | Admitting: *Deleted

## 2021-07-09 ENCOUNTER — Telehealth: Payer: Self-pay

## 2021-07-09 ENCOUNTER — Encounter: Payer: Self-pay | Admitting: Emergency Medicine

## 2021-07-09 ENCOUNTER — Ambulatory Visit
Admission: EM | Admit: 2021-07-09 | Discharge: 2021-07-09 | Disposition: A | Discharge status: Home / Self Care | Payer: Medicaid Other | Attending: Emergency Medicine | Admitting: Emergency Medicine

## 2021-07-09 DIAGNOSIS — R053 Chronic cough: Secondary | ICD-10-CM | POA: Diagnosis not present

## 2021-07-09 DIAGNOSIS — J4521 Mild intermittent asthma with (acute) exacerbation: Secondary | ICD-10-CM

## 2021-07-09 DIAGNOSIS — F418 Other specified anxiety disorders: Secondary | ICD-10-CM

## 2021-07-09 DIAGNOSIS — J302 Other seasonal allergic rhinitis: Secondary | ICD-10-CM | POA: Diagnosis not present

## 2021-07-09 DIAGNOSIS — R062 Wheezing: Secondary | ICD-10-CM | POA: Diagnosis not present

## 2021-07-09 MED ORDER — AEROCHAMBER PLUS FLO-VU LARGE MISC: 1.0000 | Freq: Once | 0 refills | Status: AC

## 2021-07-09 MED ORDER — CETIRIZINE HCL 10 MG PO TABS: 10.0000 mg | ORAL_TABLET | Freq: Every day | ORAL | 2 refills | Status: DC

## 2021-07-09 MED ORDER — IPRATROPIUM BROMIDE 0.06 % NA SOLN: 2.0000 | Freq: Four times a day (QID) | NASAL | 0 refills | Status: DC

## 2021-07-09 MED ORDER — GUAIFENESIN 400 MG PO TABS: ORAL_TABLET | ORAL | 0 refills | Status: DC

## 2021-07-09 MED ORDER — ALBUTEROL SULFATE HFA 108 (90 BASE) MCG/ACT IN AERS: 2.0000 | INHALATION_SPRAY | Freq: Four times a day (QID) | RESPIRATORY_TRACT | 0 refills | Status: DC | PRN

## 2021-07-09 MED ORDER — METHYLPREDNISOLONE SODIUM SUCC 125 MG IJ SOLR
125.0000 mg | Freq: Once | INTRAMUSCULAR | Status: AC
  Administered 2021-07-09: 15:00:00 125 mg via INTRAMUSCULAR

## 2021-07-09 MED ORDER — HYDROXYZINE HCL 25 MG PO TABS: 25.0000 mg | ORAL_TABLET | Freq: Four times a day (QID) | ORAL | 0 refills | Status: AC | PRN

## 2021-07-09 MED ORDER — PROMETHAZINE-DM 6.25-15 MG/5ML PO SYRP: 5.0000 mL | ORAL_SOLUTION | Freq: Four times a day (QID) | ORAL | 0 refills | Status: DC | PRN

## 2021-07-09 MED ORDER — FLUTICASONE PROPIONATE 50 MCG/ACT NA SUSP: 2.0000 | Freq: Every day | NASAL | 0 refills | Status: DC

## 2021-07-09 MED ORDER — METHYLPREDNISOLONE 4 MG PO TBPK: ORAL_TABLET | ORAL | 0 refills | Status: DC

## 2021-07-09 NOTE — Telephone Encounter (Signed)
?  Chief Complaint: coughing a lot at night and wheezing since January.   ?Symptoms: Cough bad at night ?Frequency: Since January.   Trying to stop smoking. ?Pertinent Negatives: Patient denies N/A ?Disposition: [] ED /[] Urgent Care (no appt availability in office) / [] Appointment(In office/virtual)/ []  [facility]/ [] Home Care/ [] Refused Recommended Disposition /[x] [place] [facility]/ []  Follow-up with PCP ?Additional Notes: Agent got her a new pt appt for October 28, 2021 with Dr. Margarita Rana.   Since she doesn't have the money I have referred her to the [facility] which she is agreeable to going to.     ?

## 2021-07-09 NOTE — Discharge Instructions (Addendum)
Please see the list below for recommended medications, dosages and frequencies to provide relief of your current symptoms:   ?  ?Methylprednisolone IM (Solu-Medrol):  To quickly address your significant respiratory inflammation, you were provided with an injection of methylprednisolone in the office today.  You should continue to feel the full benefit of the steroid for the next 4 to 6 hours.  ?  ?Methylprednisolone (Medrol Dosepak): This is a steroid that will significantly calm your upper and lower airways, please take one row of tablets daily with your breakfast meal starting tomorrow morning until the prescription is complete.    ?  ?Albuterol HFA: This is a bronchodilator, it relaxes the smooth muscles that constrict your airway in your lungs when you are feeling sick or having inflammation secondary to allergies or upper respiratory infection.  Please inhale 2 puffs twice daily every day using the spacer provided.  You can also inhale 2 more puffs as often as needed throughout the day for aggravating cough, chest tightness, feeling short of breath, wheezing.    ?  ?Fluticasone (Flonase): This is a steroid nasal spray that you use once daily, 1 spray in each nare.  After 3 to 5 days of use, you will have significant improvement of the inflammation and mucus production that is being caused by exposure to allergens.  This medication can be purchased over-the-counter however I have provided you with a prescription.    ?  ?Cetirizine (Zyrtec): This is an excellent second-generation antihistamine that helps to reduce respiratory inflammatory response to viruses and environmental allergens.  Please take 1 tablet daily at bedtime. ?  ?Ipratropium (Atrovent): This is an excellent nasal decongestant spray that does not cause rebound congestion, can be used up to 4 times daily as needed, instill 2 sprays into each nare with each use.  I have provided you with a prescription for this medication.    ?  ?Guaifenesin  (Robitussin, Mucinex): This is an expectorant.  This helps break up chest congestion and loosen up thick nasal drainage making phlegm and drainage more liquid and therefore easier to remove.  I recommend being 400 mg three times daily as needed.    ?  ?Promethazine DM: Promethazine is both a nasal decongestant and an antinausea medication that makes most patients feel fairly sleepy.  The DM is dextromethorphan, a cough suppressant found in many over-the-counter cough medications.  Please take 5 mL before bedtime to minimize your cough which will help you sleep better.  I have provided you with a prescription for this medication.    ? ?I have also provided you with a prescription for hydroxyzine that you can take 3 times daily as needed for increased stress and feelings of anxiety.  Hydroxyzine can be mildly sedating so please do not take it at the same time as Promethazine DM at nighttime. ?  ?Please follow-up within the next 7-10 days either with your primary care provider or urgent care if your symptoms do not resolve.  If you do not have a primary care provider, we will assist you in finding one. ?  ?Thank you for visiting urgent care today.  We appreciate the opportunity to participate in your care. ? ?

## 2021-07-09 NOTE — ED Triage Notes (Signed)
Pt reports cough since January. Cough worse at night and will get in spells where have trouble breathing.  ?  ?

## 2021-07-09 NOTE — Telephone Encounter (Signed)
Reason for Disposition ? [1] MODERATE longstanding difficulty breathing (e.g., speaks in phrases, SOB even at rest, pulse 100-120) AND [2] SAME as normal ?   Going to the [facility] ? ?Answer Assessment - Initial Assessment Questions ?1. RESPIRATORY STATUS: "Describe your breathing?" (e.g., wheezing, shortness of breath, unable to speak, severe coughing)  ?    Pt calling into [facility] but is not a pt there.  A new pt appt was set up by the agent. ? ?Pt is coughing and wheezing mostly at night.   Coughing up mucus.   Sweating last night.    Runny nose.   No diarrhea/vomiting.   ? ?I don't have any money so I can't go to the urgent care. ? ?2. ONSET: "When did this breathing problem begin?"  ?    Since January.   ?3. PATTERN "Does the difficult breathing come and go, or has it been constant since it started?"  ?    The cough isn't so bad during the day but at night it's awful. ?4. SEVERITY: "How bad is your breathing?" (e.g., mild, moderate, severe)  ?  - MILD: No SOB at rest, mild SOB with walking, speaks normally in sentences, can lie down, no retractions, pulse < 100.  ?  - MODERATE: SOB at rest, SOB with minimal exertion and prefers to sit, cannot lie down flat, speaks in phrases, mild retractions, audible wheezing, pulse 100-120.  ?  - SEVERE: Very SOB at rest, speaks in single words, struggling to breathe, sitting hunched forward, retractions, pulse > 120  ?    *No Answer* ?5. RECURRENT SYMPTOM: "Have you had difficulty breathing before?" If Yes, ask: "When was the last time?" and "What happened that time?"  ?    *No Answer* ?6. CARDIAC HISTORY: "Do you have any history of heart disease?" (e.g., heart attack, angina, bypass surgery, angioplasty)  ?    *No Answer* ?7. LUNG HISTORY: "Do you have any history of lung disease?"  (e.g., pulmonary embolus, asthma, emphysema) ?    I'm trying to stop smoking.   I thought maybe the cough was from that too.8. CAUSE: "What do you think is  causing the breathing problem?"  ?    *No Answer* ?9. OTHER SYMPTOMS: "Do you have any other symptoms? (e.g., dizziness, runny nose, cough, chest pain, fever) ?    Mild runny nose but I think it's allergies.  No diarrhea or vomiting. ?10. O2 SATURATION MONITOR:  "Do you use an oxygen saturation monitor (pulse oximeter) at home?" If Yes, "What is your reading (oxygen level) today?" "What is your usual oxygen saturation reading?" (e.g., 95%) ?      *No Answer* ?11. PREGNANCY: "Is there any chance you are pregnant?" "When was your last menstrual period?" ?      *No Answer* ?12. TRAVEL: "Have you traveled out of the country in the last month?" (e.g., travel history, exposures) ?      *No Answer* ? ?Protocols used: Breathing Difficulty-A-AH ? ?

## 2021-07-09 NOTE — Telephone Encounter (Signed)
Copied from CRM [id number]. Topic: General - Other ?>> Jul 09, 2021 11:10 AM Julia Torres wrote: ?Patient has no insurance and would like to discuss applying for [facility] Financial assistance ?
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
## 2021-07-15 NOTE — Telephone Encounter (Signed)
I return Pt call, LVM inform her that according to the record she will be a new Pt of the clinic in June and according to the rules of the clinic she need to see the provider first then she can apply for the financial programs ?

## 2021-08-19 ENCOUNTER — Telehealth: Payer: Self-pay | Admitting: Emergency Medicine

## 2021-08-19 MED ORDER — ALBUTEROL SULFATE HFA 108 (90 BASE) MCG/ACT IN AERS: 2.0000 | INHALATION_SPRAY | Freq: Four times a day (QID) | RESPIRATORY_TRACT | 1 refills | Status: DC | PRN

## 2021-08-19 NOTE — Telephone Encounter (Signed)
Patient requesting a refill of albuterol HFA until she sees her primary care provider in June 2023. ?

## 2021-08-28 ENCOUNTER — Ambulatory Visit
Admission: EM | Admit: 2021-08-28 | Discharge: 2021-08-28 | Disposition: A | Discharge status: Home / Self Care | Payer: Medicaid Other | Attending: Emergency Medicine | Admitting: Emergency Medicine

## 2021-08-28 DIAGNOSIS — J302 Other seasonal allergic rhinitis: Secondary | ICD-10-CM

## 2021-08-28 DIAGNOSIS — J4541 Moderate persistent asthma with (acute) exacerbation: Secondary | ICD-10-CM

## 2021-08-28 MED ORDER — GUAIFENESIN 400 MG PO TABS: ORAL_TABLET | ORAL | 0 refills | Status: DC

## 2021-08-28 MED ORDER — PROMETHAZINE-DM 6.25-15 MG/5ML PO SYRP: 5.0000 mL | ORAL_SOLUTION | Freq: Four times a day (QID) | ORAL | 0 refills | Status: DC | PRN

## 2021-08-28 MED ORDER — TRELEGY ELLIPTA 100-62.5-25 MCG/ACT IN AEPB: 1.0000 | INHALATION_SPRAY | Freq: Every morning | RESPIRATORY_TRACT | 0 refills | Status: DC

## 2021-08-28 MED ORDER — FEXOFENADINE HCL 180 MG PO TABS: 180.0000 mg | ORAL_TABLET | Freq: Every day | ORAL | 1 refills | Status: DC

## 2021-08-28 MED ORDER — MONTELUKAST SODIUM 10 MG PO TABS: 10.0000 mg | ORAL_TABLET | Freq: Every day | ORAL | 1 refills | Status: AC

## 2021-08-28 MED ORDER — METHYLPREDNISOLONE 4 MG PO TABS: ORAL_TABLET | ORAL | 0 refills | Status: AC

## 2021-08-28 MED ORDER — ALBUTEROL SULFATE HFA 108 (90 BASE) MCG/ACT IN AERS: 2.0000 | INHALATION_SPRAY | Freq: Four times a day (QID) | RESPIRATORY_TRACT | 2 refills | Status: DC | PRN

## 2021-08-28 MED ORDER — IPRATROPIUM BROMIDE 0.06 % NA SOLN: 2.0000 | Freq: Three times a day (TID) | NASAL | 1 refills | Status: DC

## 2021-08-28 MED ORDER — AZITHROMYCIN 250 MG PO TABS: ORAL_TABLET | ORAL | 0 refills | Status: AC

## 2021-08-28 MED ORDER — TRELEGY ELLIPTA 100-62.5-25 MCG/ACT IN AEPB: 1.0000 | INHALATION_SPRAY | Freq: Every morning | RESPIRATORY_TRACT | 0 refills | Status: AC

## 2021-08-28 MED ORDER — FLUTICASONE PROPIONATE 50 MCG/ACT NA SUSP: 1.0000 | Freq: Every day | NASAL | 1 refills | Status: AC

## 2021-08-28 MED ORDER — TRIAMCINOLONE ACETONIDE 40 MG/ML IJ SUSP
80.0000 mg | Freq: Once | INTRAMUSCULAR | Status: AC
  Administered 2021-08-28: 80 mg via INTRAMUSCULAR

## 2021-08-28 NOTE — ED Triage Notes (Signed)
Pt c/o cough and wheezing that began 3 weeks ago. ?

## 2021-08-28 NOTE — ED Provider Notes (Signed)
?[facility] ? ? ? ?CSN: [id number] ?Arrival date & time: 08/28/21  3154 ?  ? ?HISTORY  ? ?Chief Complaint  ?Patient presents with  ? Cough  ? Wheezing  ? ?HPI ?Julia Torres is a 36 y.o. female. Patient returns for follow-up of cough and wheezing for which she was seen about 3 weeks ago here at urgent care clinic by me.  Patient states that initially she responded well to the steroid injection she received during her visit as well as the Medrol Dosepak that she took for the 6 days following her visit.  Patient states she also had good results with albuterol inhaler however once she completed the steroids, her symptoms of cough, shortness of breath returned as before, patient states she thinks that they might even be worse at this time.  Patient states that her cough is so bad that sometimes she feels like she cannot inhale for coughing.  Patient states she quit smoking 6 months ago, not currently vaping.  Patient denies fever, aches, chills, nausea, vomiting, diarrhea, headache, purulent nasal discharge, ear pain, ear pressure, loss of hearing, dizziness. ? ?The history is provided by the patient.  ?Past Medical History:  ?Diagnosis Date  ? Anxiety   ? Depression   ? Pregnancy induced hypertension   ? ?Patient Active Problem List  ? Diagnosis Date Noted  ? SVD (spontaneous vaginal delivery) 12/10/2018  ? Gestational hypertension, third trimester 12/09/2018  ? Gestational hypertension 12/07/2018  ? Supervision of other normal pregnancy, antepartum 06/19/2018  ? Hx of preeclampsia, prior pregnancy, currently pregnant 06/19/2018  ? ?Past Surgical History:  ?Procedure Laterality Date  ? NO PAST SURGERIES    ? ?OB History   ? ? Gravida  ?3  ? Para  ?2  ? Term  ?2  ? Preterm  ?   ? AB  ?1  ? Living  ?2  ?  ? ? SAB  ?   ? IAB  ?1  ? Ectopic  ?   ? Multiple  ?0  ? Live Births  ?2  ?   ?  ?  ? ?Home Medications   ? ?Prior to Admission medications   ?Medication Sig Start Date End Date Taking? Authorizing Provider   ?albuterol (VENTOLIN HFA) 108 (90 Base) MCG/ACT inhaler Inhale 2 puffs into the lungs every 6 (six) hours as needed for wheezing or shortness of breath (Cough). 08/28/21  Yes Theadora Rama Scales, PA-C  ?azithromycin (ZITHROMAX) 250 MG tablet Take 2 tablets (500 mg total) by mouth daily for 1 day, THEN 1 tablet (250 mg total) daily for 4 days. 08/28/21 09/02/21 Yes Theadora Rama Scales, PA-C  ?fexofenadine (ALLEGRA) 180 MG tablet Take 1 tablet (180 mg total) by mouth daily. 08/28/21 02/24/22 Yes Theadora Rama Scales, PA-C  ?fluticasone (FLONASE) 50 MCG/ACT nasal spray Place 1 spray into both nostrils daily. 08/28/21  Yes Theadora Rama Scales, PA-C  ?Fluticasone-Umeclidin-Vilant (TRELEGY ELLIPTA) 100-62.5-25 MCG/ACT AEPB Inhale 1 puff into the lungs in the morning. 08/28/21 11/26/21 Yes Theadora Rama Scales, PA-C  ?Fluticasone-Umeclidin-Vilant (TRELEGY ELLIPTA) 100-62.5-25 MCG/ACT AEPB Inhale 1 puff into the lungs in the morning for 14 days. 08/28/21 09/11/21 Yes Theadora Rama Scales, PA-C  ?guaifenesin (HUMIBID E) 400 MG TABS tablet Take 1 tablet 3 times daily as needed for chest congestion and cough 08/28/21  Yes Theadora Rama Scales, PA-C  ?ipratropium (ATROVENT) 0.06 % nasal spray Place 2 sprays into both nostrils 3 (three) times daily. As needed for nasal congestion, runny nose 08/28/21  Yes Theadora Rama Scales, PA-C  ?methylPREDNISolone (MEDROL) 4 MG tablet Take 8 tablets (32 mg total) by mouth daily for 1 day, THEN 7 tablets (28 mg total) daily for 1 day, THEN 6 tablets (24 mg total) daily for 1 day, THEN 5 tablets (20 mg total) daily for 1 day, THEN 4 tablets (16 mg total) daily for 1 day, THEN 3 tablets (12 mg total) daily for 1 day, THEN 2 tablets (8 mg total) daily for 1 day, THEN 1 tablet (4 mg total) daily for 1 day. 08/28/21 09/05/21 Yes Theadora Rama Scales, PA-C  ?montelukast (SINGULAIR) 10 MG tablet Take 1 tablet (10 mg total) by mouth at bedtime. 08/28/21 02/24/22 Yes Theadora Rama Scales,  PA-C  ?promethazine-dextromethorphan (PROMETHAZINE-DM) 6.25-15 MG/5ML syrup Take 5 mLs by mouth 4 (four) times daily as needed for cough. 08/28/21  Yes Theadora Rama Scales, PA-C  ? ?Family History ?Family History  ?Problem Relation Age of Onset  ? HIV Mother   ? ?Social History ?Social History  ? ?Tobacco Use  ? Smoking status: Every Day  ?  Packs/day: 0.25  ?  Types: Cigarettes  ? Smokeless tobacco: Never  ? Tobacco comments:  ?  4-5 cigarettes in  day  ?Vaping Use  ? Vaping Use: Never used  ?Substance Use Topics  ? Alcohol use: Not Currently  ? Drug use: Yes  ?  Types: Marijuana  ?  Comment: last used in middle of this pregnancy   ? ?Allergies   ?Patient has no known allergies. ? ?Review of Systems ?Review of Systems ?Pertinent findings noted in history of present illness.  ? ?Physical Exam ?Triage Vital Signs ?ED Triage Vitals  ?Enc Vitals Group  ?   BP 02/27/21 0827 (!) 147/82  ?   Pulse Rate 02/27/21 0827 72  ?   Resp 02/27/21 0827 18  ?   Temp 02/27/21 0827 98.3 ?F (36.8 ?C)  ?   Temp Source 02/27/21 0827 Oral  ?   SpO2 02/27/21 0827 98 %  ?   Weight --   ?   Height --   ?   Head Circumference --   ?   Peak Flow --   ?   Pain Score 02/27/21 0826 5  ?   Pain Loc --   ?   Pain Edu? --   ?   Excl. in [place]? --   ?No data found. ? ?Updated Vital Signs ?BP 131/88 (BP Location: Left Arm)   Pulse 66   Temp 98.3 ?F (36.8 ?C) (Oral)   Resp 20   LMP 08/21/2021   SpO2 98%  ? ?Physical Exam ?Vitals and nursing note reviewed.  ?Constitutional:   ?   General: She is awake. She is not in acute distress. ?   Appearance: Normal appearance. She is well-developed and well-groomed. She is obese. She is ill-appearing. She is not toxic-appearing or diaphoretic.  ?HENT:  ?   Head: Normocephalic and atraumatic.  ?   Salivary Glands: Right salivary gland is not diffusely enlarged or tender. Left salivary gland is not diffusely enlarged or tender.  ?   Right Ear: Ear canal and external ear normal. No drainage. A middle ear effusion  is present. There is no impacted cerumen. Tympanic membrane is bulging. Tympanic membrane is not injected or erythematous.  ?   Left Ear: Ear canal and external ear normal. No drainage. A middle ear effusion is present. There is no impacted cerumen. Tympanic membrane is bulging. Tympanic membrane is not  injected or erythematous.  ?   Ears:  ?   Comments: Bilateral EACs normal, both TMs bulging with clear fluid ?   Nose: Rhinorrhea present. No nasal deformity, septal deviation, signs of injury, nasal tenderness, mucosal edema or congestion. Rhinorrhea is clear.  ?   Right Nostril: Occlusion present. No foreign body, epistaxis or septal hematoma.  ?   Left Nostril: Occlusion present. No foreign body, epistaxis or septal hematoma.  ?   Right Turbinates: Enlarged, swollen and pale.  ?   Left Turbinates: Enlarged, swollen and pale.  ?   Right Sinus: No maxillary sinus tenderness or frontal sinus tenderness.  ?   Left Sinus: No maxillary sinus tenderness or frontal sinus tenderness.  ?   Mouth/Throat:  ?   Lips: Pink. No lesions.  ?   Mouth: Mucous membranes are moist. No oral lesions.  ?   Pharynx: Oropharynx is clear. Uvula midline. No posterior oropharyngeal erythema or uvula swelling.  ?   Tonsils: No tonsillar exudate. 0 on the right. 0 on the left.  ?   Comments: Postnasal drip ?Eyes:  ?   General: Lids are normal.     ?   Right eye: No discharge.     ?   Left eye: No discharge.  ?   Extraocular Movements: Extraocular movements intact.  ?   Conjunctiva/sclera: Conjunctivae normal.  ?   Right eye: Right conjunctiva is not injected.  ?   Left eye: Left conjunctiva is not injected.  ?Neck:  ?   Trachea: Trachea and phonation normal.  ?Cardiovascular:  ?   Rate and Rhythm: Normal rate and regular rhythm.  ?   Pulses: Normal pulses.  ?   Heart sounds: Normal heart sounds. No murmur heard. ?  No friction rub. No gallop.  ?Pulmonary:  ?   Effort: Pulmonary effort is normal. No tachypnea, bradypnea, accessory muscle usage,  prolonged expiration, respiratory distress or retractions.  ?   Breath sounds: No stridor, decreased air movement or transmitted upper airway sounds. Examination of the right-upper field reveals wheezing and

## 2021-08-28 NOTE — Discharge Instructions (Addendum)
Your symptoms and my physical exam findings are concerning for reactive airway disease with an acute exacerbation.  Reactive airways disease is when your lower airway becomes inflamed due to viral infection, bacterial infection or allergy exposure. ?   ?Please see the list below for recommended medications, dosages and frequencies to provide relief of your current symptoms:   ?  ?Kenalog IM (triamcinolone):  To quickly address your significant respiratory inflammation, you were provided with an injection of methylprednisolone in the office today.  You should continue to feel the full benefit of the steroid for the next 24-36 hours.  ? ?Methylprednisolone: To keep your respiratory tract calm, please begin taking methylprednisolone tomorrow morning.  Please take all tablets of the daily recommended dose with your breakfast meal today for a full 8-day treatment.   ? ?Trelegy (fluticasone, vilanterol and umeclidinium):  This inhaled medication contains a corticosteroid and long-acting form of albuterol.  The inhaled steroid and this medication  is not absorbed into the body and will not cause side effects such as increased blood sugar levels, irritability, sleeplessness or weight gain.  Inhaled corticosteroid are sort of like topical steroid creams but, as you can imagine, it is not practical to attempt to rub a steroid cream inside of your lungs.  The long-acting albuterol works similarly to the short acting albuterol found in your rescue inhaler but provides 24-hour relaxation of the smooth muscles that open and constrict your airways; your short acting rescue inhaler can only provide for a few hours this benefit for a few hours.  The third unique ingredient, umeclidinium, is an antimuscarinic and works similarly to your albuterol, providing long-acting relaxation of the smooth muscles in your airway.  Please feel free to continue using your short acting rescue inhaler as often as needed throughout the day for  shortness of breath, wheezing, and cough. ?  ?ProAir, Ventolin, Proventil (albuterol): This inhaled medication contains a short acting beta agonist bronchodilator.  This medication works on the smooth muscle that opens and constricts of your airways by relaxing the muscle.  The result of relaxation of the smooth muscle is increased air movement and improved work of breathing.  This is a short acting medication that can be used every 4-6 hours as needed for increased work of breathing, shortness of breath, wheezing and excessive coughing.  I have provided you with a new prescription, please continue to use this medication liberally and generously. ?  ?Azithromycin (Z-Pak): At this point, I believe it is very likely that there is some bacterial superinfection that is prolonging inflammation in your lungs.  Azithromycin is a unique antibiotic not only kills bacteria but it also has a anti-inflammatory effect on soft tissue as well. Please take 2 tablets the first day, then take 1 tablet daily every day thereafter until complete.  It is commonly prescribed with oral steroids for asthma exacerbations. ?  ?Allegra (fexofenadine): This is an excellent second-generation antihistamine that helps to reduce respiratory inflammatory response to environmental allergens.  This medication is not known to cause daytime sleepiness so it can be taken in the daytime.  If you find that it does make you sleepy, please feel free to take it at bedtime. ? ?Singulair (montelukast): This is a mast cell stabilizer that works well with antihistamines.  Mast cells are responsible for stimulating histamine production so you can imagine that if we can reduce the activity of your mast cells, then fewer histamines will be produced and inflammation caused by allergy exposure will  be significantly reduced.  I recommend that you take this medication at the same time you take your antihistamine. ? ?Flonase (fluticasone): This is a steroid nasal spray  that you use once daily, 1 spray in each nare.  This medication does not work well if you decide to use it only used as you feel you need to, it works best used on a daily basis.  After 3 to 5 days of use, you will notice significant reduction of the inflammation and mucus production that is currently being caused by exposure to allergens, whether seasonal or environmental.  The most common side effect of this medication is nosebleeds.  If you experience a nosebleed, please discontinue use for 1 week, then feel free to resume.  I have provided you with a prescription but you can also purchase this medication over-the-counter if your insurance will not cover it. ?  ?Atrovent (ipratropium): This is an excellent nasal decongestant spray that does not cause rebound congestion, please instill 2 sprays into each nare with each use.  Because nasal steroids can take several days before they begin to provide full benefit, I recommend that you use this spray in addition to the nasal steroid prescribed for you.  Please use it after you have used your nasal steroid and repeat up to 4 times daily as needed.  I have provided you with a prescription for this medication.  You can stop using Atrovent when you no longer remember to use it, this will mean that the Flonase is working well and Atrovent is no longer needed. ? ?Guaifenesin (Robitussin, Mucinex): This is an expectorant.  This helps break up chest congestion and loosen up thick nasal drainage making phlegm and drainage more liquid and therefore easier to remove.  I recommend being 400 mg three times daily as needed.    ?  ?Promethazine DM: Promethazine is both a nasal decongestant and an antinausea medication that makes most patients feel fairly sleepy.  The DM is dextromethorphan, a cough suppressant found in many over-the-counter cough medications.  Please take 5 mL before bedtime to minimize your cough which will help you sleep better.  I have provided you with a  prescription for this medication.    ?   ?After you have finished the azithromycin and methylprednisolone, please continue to use Trelegy, ProAir, Allegra, Singulair, Flonase and Atrovent daily.   ? ?Please follow-up within the next 7-10 days either with your primary care provider or urgent care if your symptoms do not resolve.  If you do not have a primary care provider, we will assist you in finding one. ?  ? Thank you for visiting urgent care today.  We appreciate the opportunity to participate in your care. ?  ? ? ? ?Please activate your Trelegy coupon before you try to pick this prescription up at the pharmacy. ?

## 2021-09-10 ENCOUNTER — Ambulatory Visit: Payer: Medicaid Other | Admitting: Family Medicine

## 2021-10-14 DIAGNOSIS — H5213 Myopia, bilateral: Secondary | ICD-10-CM | POA: Diagnosis not present

## 2021-10-26 ENCOUNTER — Telehealth: Payer: Self-pay | Admitting: Family Medicine

## 2021-10-26 MED ORDER — FLUTICASONE-SALMETEROL 100-50 MCG/ACT IN AEPB
1.0000 | INHALATION_SPRAY | Freq: Two times a day (BID) | RESPIRATORY_TRACT | 0 refills | Status: DC
Start: 2021-10-26 — End: 2022-02-09

## 2021-10-28 ENCOUNTER — Ambulatory Visit: Payer: Medicaid Other | Admitting: Family Medicine

## 2022-02-09 ENCOUNTER — Ambulatory Visit
Admission: EM | Admit: 2022-02-09 | Discharge: 2022-02-09 | Disposition: A | Discharge status: Home / Self Care | Payer: Medicaid Other | Attending: Emergency Medicine | Admitting: Emergency Medicine

## 2022-02-09 DIAGNOSIS — Z760 Encounter for issue of repeat prescription: Secondary | ICD-10-CM | POA: Diagnosis not present

## 2022-02-09 DIAGNOSIS — J4521 Mild intermittent asthma with (acute) exacerbation: Secondary | ICD-10-CM

## 2022-02-09 MED ORDER — ALBUTEROL SULFATE HFA 108 (90 BASE) MCG/ACT IN AERS: 2.0000 | INHALATION_SPRAY | RESPIRATORY_TRACT | 1 refills | Status: AC | PRN

## 2022-02-09 MED ORDER — FLUTICASONE-SALMETEROL 100-50 MCG/ACT IN AEPB: 1.0000 | INHALATION_SPRAY | Freq: Two times a day (BID) | RESPIRATORY_TRACT | 1 refills | Status: DC

## 2022-02-09 NOTE — Discharge Instructions (Addendum)
2 puffs from your albuterol inhaler every 4 hours using your spacer for 2 days, then every 6 hours for 2 days, then as needed.  You can back off on the albuterol if you start to improve sooner.  Restart the Advair, Flonase nasal spray and continue Singulair.

## 2022-02-09 NOTE — ED Triage Notes (Signed)
Patient presents to [facility] for medication refill for her inhalers. Hx of asthma.

## 2022-02-09 NOTE — ED Provider Notes (Signed)
HPI  SUBJECTIVE:  Julia Torres is a 36 y.o. female who presents with an asthma exacerbation since running out of her Advair 2 weeks ago.  She has been using her rescue albuterol inhaler 2-3 times a day, normally uses it as needed.  The Advair normally controls her asthma well.  She has now run out of her albuterol inhaler.  She reports coughing, wheezing, shortness of breath worse at night.  No dyspnea on exertion, chest pain.  She is taking Singulair, and using her albuterol inhaler with improvement in her symptoms.  Symptoms are worse with coughing.  She has a past medical history of asthma.  LMP: 9/15.  Denies possibility being pregnant.  PCP: She is establishing care at [facility].    Past Medical History:  Diagnosis Date   Anxiety    Depression    Pregnancy induced hypertension     Past Surgical History:  Procedure Laterality Date   NO PAST SURGERIES      Family History  Problem Relation Age of Onset   HIV Mother     Social History   Tobacco Use   Smoking status: Every Day    Packs/day: 0.25    Types: Cigarettes   Smokeless tobacco: Never   Tobacco comments:    4-5 cigarettes in  day  Vaping Use   Vaping Use: Never used  Substance Use Topics   Alcohol use: Not Currently   Drug use: Yes    Types: Marijuana    Comment: last used in middle of this pregnancy     No current facility-administered medications for this encounter.  Current Outpatient Medications:    albuterol (VENTOLIN HFA) 108 (90 Base) MCG/ACT inhaler, Inhale 2 puffs into the lungs every 4 (four) hours as needed for wheezing or shortness of breath (Cough)., Disp: 18 g, Rfl: 1   fexofenadine (ALLEGRA) 180 MG tablet, Take 1 tablet (180 mg total) by mouth daily., Disp: 90 tablet, Rfl: 1   fluticasone (FLONASE) 50 MCG/ACT nasal spray, Place 1 spray into both nostrils daily., Disp: 48 mL, Rfl: 1   fluticasone-salmeterol (ADVAIR DISKUS) 100-50 MCG/ACT AEPB, Inhale 1 puff into the lungs 2 (two) times  daily., Disp: 1 each, Rfl: 1   ipratropium (ATROVENT) 0.06 % nasal spray, Place 2 sprays into both nostrils 3 (three) times daily. As needed for nasal congestion, runny nose, Disp: 15 mL, Rfl: 1   montelukast (SINGULAIR) 10 MG tablet, Take 1 tablet (10 mg total) by mouth at bedtime., Disp: 90 tablet, Rfl: 1   promethazine-dextromethorphan (PROMETHAZINE-DM) 6.25-15 MG/5ML syrup, Take 5 mLs by mouth 4 (four) times daily as needed for cough., Disp: 180 mL, Rfl: 0  No Known Allergies   ROS  As noted in HPI.   Physical Exam  BP 120/83 (BP Location: Left Arm)   Pulse 67   Temp 98.3 F (36.8 C) (Temporal)   Resp 16   LMP 01/15/2022   SpO2 98%   Constitutional: Well developed, well nourished, no acute distress Eyes:  EOMI, conjunctiva normal bilaterally HENT: Normocephalic, atraumatic,mucus membranes moist Respiratory: Normal inspiratory effort, faint expiratory wheezing throughout all lung fields.  Good air movement.  No anterior, lateral chest wall tenderness Cardiovascular: Normal rate, regular rhythm, no murmurs, rubs, gallops GI: nondistended skin: No rash, skin intact Musculoskeletal: no deformities Neurologic: Alert & oriented x 3, no focal neuro deficits Psychiatric: Speech and behavior appropriate   ED Course   Medications - No data to display  No orders of the defined types  were placed in this encounter.   No results found for this or any previous visit (from the past 24 hour(s)). No results found.  ED Clinical Impression  1. Mild intermittent asthma with acute exacerbation   2. Medication refill      ED Assessment/Plan      Patient with an asthma exacerbation needing a refill on her Advair and albuterol.  Provided a spacer here.  She states that she does not need more Singulair.  Do not think that she needs oral steroids at this time.  She has follow-up scheduled with a PCP in November.  May return here as needed.  Discussed MDM, treatment plan, and plan  for follow-up with patient. patient agrees with plan.   Meds ordered this encounter  Medications   albuterol (VENTOLIN HFA) 108 (90 Base) MCG/ACT inhaler    Sig: Inhale 2 puffs into the lungs every 4 (four) hours as needed for wheezing or shortness of breath (Cough).    Dispense:  18 g    Refill:  1    Brand substitution permitted   fluticasone-salmeterol (ADVAIR DISKUS) 100-50 MCG/ACT AEPB    Sig: Inhale 1 puff into the lungs 2 (two) times daily.    Dispense:  1 each    Refill:  1      *This clinic note was created using Lobby[employer]. Therefore, there may be occasional mistakes despite careful proofreading.  ?    Melynda Ripple, MD 02/12/22 1504

## 2022-03-27 ENCOUNTER — Ambulatory Visit
Admission: EM | Admit: 2022-03-27 | Discharge: 2022-03-27 | Disposition: A | Discharge status: Home / Self Care | Payer: Medicaid Other | Attending: Urgent Care | Admitting: Urgent Care

## 2022-03-27 ENCOUNTER — Ambulatory Visit (INDEPENDENT_AMBULATORY_CARE_PROVIDER_SITE_OTHER): Payer: Medicaid Other

## 2022-03-27 DIAGNOSIS — M25571 Pain in right ankle and joints of right foot: Secondary | ICD-10-CM

## 2022-03-27 DIAGNOSIS — S93401A Sprain of unspecified ligament of right ankle, initial encounter: Secondary | ICD-10-CM

## 2022-03-27 DIAGNOSIS — S82891A Other fracture of right lower leg, initial encounter for closed fracture: Secondary | ICD-10-CM

## 2022-03-27 DIAGNOSIS — M25471 Effusion, right ankle: Secondary | ICD-10-CM | POA: Diagnosis not present

## 2022-03-27 MED ORDER — NAPROXEN 500 MG PO TABS: 500.0000 mg | ORAL_TABLET | Freq: Two times a day (BID) | ORAL | 0 refills | Status: DC

## 2022-03-27 NOTE — Discharge Instructions (Addendum)
Please make sure you follow-up with [facility] first thing on Monday.  Wear the boot at all times.  Use crutches to move around.  Take naproxen for pain and inflammation.

## 2022-03-27 NOTE — ED Provider Notes (Signed)
[facility]  Note:  This document was prepared using Conservation officer, historic buildings and may include unintentional dictation errors.  MRN: [id number] DOB: 06/30/1985  Subjective:   Julia Torres is a 36 y.o. female presenting for 1 week history of acute onset persistent right ankle pain with bruising and swelling.  Patient reports that she suffered an ankle sprain then.  She has been nursing her ankle since then.  However she is noticing bruising along her leg and foot with persistent swelling and pain.  Has been using an Ace wrap.  No current facility-administered medications for this encounter.  Current Outpatient Medications:    albuterol (VENTOLIN HFA) 108 (90 Base) MCG/ACT inhaler, Inhale 2 puffs into the lungs every 4 (four) hours as needed for wheezing or shortness of breath (Cough)., Disp: 18 g, Rfl: 1   fexofenadine (ALLEGRA) 180 MG tablet, Take 1 tablet (180 mg total) by mouth daily., Disp: 90 tablet, Rfl: 1   fluticasone (FLONASE) 50 MCG/ACT nasal spray, Place 1 spray into both nostrils daily., Disp: 48 mL, Rfl: 1   fluticasone-salmeterol (ADVAIR DISKUS) 100-50 MCG/ACT AEPB, Inhale 1 puff into the lungs 2 (two) times daily., Disp: 1 each, Rfl: 1   ipratropium (ATROVENT) 0.06 % nasal spray, Place 2 sprays into both nostrils 3 (three) times daily. As needed for nasal congestion, runny nose, Disp: 15 mL, Rfl: 1   montelukast (SINGULAIR) 10 MG tablet, Take 1 tablet (10 mg total) by mouth at bedtime., Disp: 90 tablet, Rfl: 1   promethazine-dextromethorphan (PROMETHAZINE-DM) 6.25-15 MG/5ML syrup, Take 5 mLs by mouth 4 (four) times daily as needed for cough., Disp: 180 mL, Rfl: 0   No Known Allergies  Past Medical History:  Diagnosis Date   Anxiety    Depression    Pregnancy induced hypertension      Past Surgical History:  Procedure Laterality Date   NO PAST SURGERIES      Family History  Problem Relation Age of Onset   HIV Mother     Social  History   Tobacco Use   Smoking status: Every Day    Packs/day: 0.25    Types: Cigarettes   Smokeless tobacco: Never   Tobacco comments:    4-5 cigarettes in  day  Vaping Use   Vaping Use: Some days  Substance Use Topics   Alcohol use: Yes    Comment: occ   Drug use: Not Currently    ROS   Objective:   Vitals: BP (!) 151/92 (BP Location: Left Arm)   Pulse 78   Temp 98.4 F (36.9 C) (Oral)   Resp 16   LMP 03/13/2022 (Approximate)   SpO2 98%   Physical Exam Constitutional:      General: She is not in acute distress.    Appearance: Normal appearance. She is well-developed. She is not ill-appearing, toxic-appearing or diaphoretic.  HENT:     Head: Normocephalic and atraumatic.     Nose: Nose normal.     Mouth/Throat:     Mouth: Mucous membranes are moist.  Eyes:     General: No scleral icterus.       Right eye: No discharge.        Left eye: No discharge.     Extraocular Movements: Extraocular movements intact.  Cardiovascular:     Rate and Rhythm: Normal rate.  Pulmonary:     Effort: Pulmonary effort is normal.  Musculoskeletal:     Right ankle: Swelling and ecchymosis (laterally) present. No  deformity or lacerations. Tenderness present over the lateral malleolus, ATF ligament, AITF ligament and base of 5th metatarsal. No medial malleolus, CF ligament, posterior TF ligament or proximal fibula tenderness. Normal range of motion.     Right Achilles Tendon: No tenderness or defects. Thompson's test negative.  Skin:    General: Skin is warm and dry.  Neurological:     General: No focal deficit present.     Mental Status: She is alert and oriented to person, place, and time.  Psychiatric:        Mood and Affect: Mood normal.        Behavior: Behavior normal.     DG Ankle Complete Right  Result Date: 03/27/2022 CLINICAL DATA:  RIGHT ankle pain and injury 1 week ago. EXAM: RIGHT ANKLE - COMPLETE 3+ VIEW COMPARISON:  None Available. FINDINGS: A small fracture  fragment at the tip of the fibula is noted. No other fracture, subluxation or dislocation identified. LATERAL soft tissue swelling is noted. A plantar calcaneal spur is present. IMPRESSION: Small fracture of the fibular tip. Electronically Signed   By: Harmon Pier M.D.   On: 03/27/2022 14:32     Assessment and Plan :   PDMP not reviewed this encounter.  1. Sprain of right ankle, unspecified ligament, initial encounter   2. Pain and swelling of ankle, right     Recommended CAM Walker boot.  Use crutches to ambulate.  Follow-up with [facility] soon as possible.  Counseled patient on potential for adverse effects with medications prescribed/recommended today, ER and return-to-clinic precautions discussed, patient verbalized understanding.    Wallis Bamberg, PA-C 03/27/22 1449

## 2022-03-27 NOTE — ED Triage Notes (Signed)
Pt states she injured/sprained right ankle 1 week ago-states has ace wrap in place-NAD-steady gait

## 2022-04-09 DIAGNOSIS — M25571 Pain in right ankle and joints of right foot: Secondary | ICD-10-CM | POA: Diagnosis not present

## 2022-04-09 DIAGNOSIS — S8264XA Nondisplaced fracture of lateral malleolus of right fibula, initial encounter for closed fracture: Secondary | ICD-10-CM | POA: Diagnosis not present

## 2022-04-09 DIAGNOSIS — S93491A Sprain of other ligament of right ankle, initial encounter: Secondary | ICD-10-CM | POA: Diagnosis not present

## 2022-07-04 ENCOUNTER — Ambulatory Visit
Admission: EM | Admit: 2022-07-04 | Discharge: 2022-07-04 | Disposition: A | Discharge status: Home / Self Care | Payer: Medicaid Other | Attending: Urgent Care | Admitting: Urgent Care

## 2022-07-04 DIAGNOSIS — J018 Other acute sinusitis: Secondary | ICD-10-CM | POA: Diagnosis not present

## 2022-07-04 DIAGNOSIS — J309 Allergic rhinitis, unspecified: Secondary | ICD-10-CM | POA: Diagnosis not present

## 2022-07-04 DIAGNOSIS — J4540 Moderate persistent asthma, uncomplicated: Secondary | ICD-10-CM

## 2022-07-04 DIAGNOSIS — H5789 Other specified disorders of eye and adnexa: Secondary | ICD-10-CM

## 2022-07-04 MED ORDER — AMOXICILLIN-POT CLAVULANATE 875-125 MG PO TABS: 1.0000 | ORAL_TABLET | Freq: Two times a day (BID) | ORAL | 0 refills | Status: DC

## 2022-07-04 MED ORDER — TOBRAMYCIN 0.3 % OP SOLN: 1.0000 [drp] | OPHTHALMIC | 0 refills | Status: DC

## 2022-07-04 MED ORDER — PREDNISONE 20 MG PO TABS: ORAL_TABLET | ORAL | 0 refills | Status: DC

## 2022-07-04 MED ORDER — PROMETHAZINE-DM 6.25-15 MG/5ML PO SYRP: 5.0000 mL | ORAL_SOLUTION | Freq: Four times a day (QID) | ORAL | 0 refills | Status: DC | PRN

## 2022-07-04 NOTE — ED Triage Notes (Signed)
Pt c/o redness and drainage x 4 days-cough, nasal congestion x 1 month-NAD-steady gait

## 2022-07-04 NOTE — ED Provider Notes (Signed)
[facility]  Note:  This document was prepared using Systems analyst and may include unintentional dictation errors.  MRN: [id number] DOB: 1985-05-29  Subjective:   Julia Torres is a 37 y.o. female presenting for 1 month history of persistent sinus congestion, sinus drainage, coughing, bilateral ear fullness.  In the past 4 days she has developed bilateral eye redness and drainage, matting of her eyelashes.  Has a history of asthma, allergic rhinitis.  Does not need a refill on her inhaler.  No overt chest pain.  Has allergic rhinitis but has not been using her Allegra.  Has been doing Flonase.  No current facility-administered medications for this encounter.  Current Outpatient Medications:    albuterol (VENTOLIN HFA) 108 (90 Base) MCG/ACT inhaler, Inhale 2 puffs into the lungs every 4 (four) hours as needed for wheezing or shortness of breath (Cough)., Disp: 18 g, Rfl: 1   fexofenadine (ALLEGRA) 180 MG tablet, Take 1 tablet (180 mg total) by mouth daily., Disp: 90 tablet, Rfl: 1   fluticasone (FLONASE) 50 MCG/ACT nasal spray, Place 1 spray into both nostrils daily., Disp: 48 mL, Rfl: 1   fluticasone-salmeterol (ADVAIR DISKUS) 100-50 MCG/ACT AEPB, Inhale 1 puff into the lungs 2 (two) times daily., Disp: 1 each, Rfl: 1   ipratropium (ATROVENT) 0.06 % nasal spray, Place 2 sprays into both nostrils 3 (three) times daily. As needed for nasal congestion, runny nose, Disp: 15 mL, Rfl: 1   montelukast (SINGULAIR) 10 MG tablet, Take 1 tablet (10 mg total) by mouth at bedtime., Disp: 90 tablet, Rfl: 1   naproxen (NAPROSYN) 500 MG tablet, Take 1 tablet (500 mg total) by mouth 2 (two) times daily with a meal., Disp: 30 tablet, Rfl: 0   promethazine-dextromethorphan (PROMETHAZINE-DM) 6.25-15 MG/5ML syrup, Take 5 mLs by mouth 4 (four) times daily as needed for cough., Disp: 180 mL, Rfl: 0   No Known Allergies  Past Medical History:  Diagnosis Date   Anxiety     Depression    Pregnancy induced hypertension      Past Surgical History:  Procedure Laterality Date   NO PAST SURGERIES      Family History  Problem Relation Age of Onset   HIV Mother     Social History   Tobacco Use   Smoking status: Every Day    Packs/day: 0.25    Types: Cigarettes   Smokeless tobacco: Never   Tobacco comments:    4-5 cigarettes in  day  Vaping Use   Vaping Use: Former  Substance Use Topics   Alcohol use: Yes    Comment: occ   Drug use: Not Currently    ROS   Objective:   Vitals: BP 112/76 (BP Location: Left Arm)   Pulse 91   Temp 98.8 F (37.1 C) (Oral)   Resp 18   LMP 06/20/2022 (Approximate)   SpO2 96%   Physical Exam Constitutional:      General: She is not in acute distress.    Appearance: Normal appearance. She is well-developed and normal weight. She is not ill-appearing, toxic-appearing or diaphoretic.  HENT:     Head: Normocephalic and atraumatic.     Right Ear: Tympanic membrane, ear canal and external ear normal. No drainage or tenderness. No middle ear effusion. There is no impacted cerumen. Tympanic membrane is not erythematous or bulging.     Left Ear: Tympanic membrane, ear canal and external ear normal. No drainage or tenderness.  No middle ear  effusion. There is no impacted cerumen. Tympanic membrane is not erythematous or bulging.     Nose: Congestion and rhinorrhea present.     Mouth/Throat:     Mouth: Mucous membranes are moist. No oral lesions.     Pharynx: No pharyngeal swelling, oropharyngeal exudate, posterior oropharyngeal erythema or uvula swelling.     Tonsils: No tonsillar exudate or tonsillar abscesses.  Eyes:     General: Lids are normal. Lids are everted, no foreign bodies appreciated. Vision grossly intact. No scleral icterus.       Right eye: No foreign body, discharge or hordeolum.        Left eye: No foreign body, discharge or hordeolum.     Extraocular Movements: Extraocular movements intact.      Right eye: Normal extraocular motion.     Left eye: Normal extraocular motion and no nystagmus.     Conjunctiva/sclera:     Right eye: Right conjunctiva is injected. No chemosis, exudate or hemorrhage.    Left eye: Left conjunctiva is injected. No chemosis, exudate or hemorrhage. Cardiovascular:     Rate and Rhythm: Normal rate and regular rhythm.     Heart sounds: Normal heart sounds. No murmur heard.    No friction rub. No gallop.  Pulmonary:     Effort: Pulmonary effort is normal. No respiratory distress.     Breath sounds: No stridor. No wheezing, rhonchi or rales.  Chest:     Chest wall: No tenderness.  Musculoskeletal:     Cervical back: Normal range of motion and neck supple.  Lymphadenopathy:     Cervical: No cervical adenopathy.  Skin:    General: Skin is warm and dry.  Neurological:     General: No focal deficit present.     Mental Status: She is alert and oriented to person, place, and time.  Psychiatric:        Mood and Affect: Mood normal.        Behavior: Behavior normal.     Assessment and Plan :   PDMP not reviewed this encounter.  1. Other acute sinusitis, recurrence not specified   2. Moderate persistent asthma without complication   3. Red eyes     Will start empiric treatment for sinusitis with Augmentin.  In the context of her allergic rhinitis, asthma also recommended a prednisone course.  Schedule the albuterol inhaler as needed.  Will cover for secondary bacterial conjunctivitis with tobramycin.  Recommended supportive care otherwise. Deferred imaging given clear cardiopulmonary exam, hemodynamically stable vital signs. Counseled patient on potential for adverse effects with medications prescribed/recommended today, ER and return-to-clinic precautions discussed, patient verbalized understanding.    [name], [place] 07/04/22 1537

## 2022-12-03 DIAGNOSIS — Z7689 Persons encountering health services in other specified circumstances: Secondary | ICD-10-CM | POA: Diagnosis not present

## 2022-12-03 DIAGNOSIS — F331 Major depressive disorder, recurrent, moderate: Secondary | ICD-10-CM | POA: Diagnosis not present

## 2022-12-03 DIAGNOSIS — R600 Localized edema: Secondary | ICD-10-CM | POA: Diagnosis not present

## 2022-12-03 DIAGNOSIS — F411 Generalized anxiety disorder: Secondary | ICD-10-CM | POA: Diagnosis not present

## 2023-01-11 DIAGNOSIS — F411 Generalized anxiety disorder: Secondary | ICD-10-CM | POA: Diagnosis not present

## 2023-01-11 DIAGNOSIS — J4541 Moderate persistent asthma with (acute) exacerbation: Secondary | ICD-10-CM | POA: Diagnosis not present

## 2023-01-11 DIAGNOSIS — L659 Nonscarring hair loss, unspecified: Secondary | ICD-10-CM | POA: Diagnosis not present

## 2023-01-11 DIAGNOSIS — F331 Major depressive disorder, recurrent, moderate: Secondary | ICD-10-CM | POA: Diagnosis not present

## 2023-03-19 ENCOUNTER — Ambulatory Visit (HOSPITAL_COMMUNITY)
Admission: EM | Admit: 2023-03-19 | Discharge: 2023-03-19 | Disposition: A | Discharge status: Home / Self Care | Payer: 59 | Attending: Internal Medicine | Admitting: Internal Medicine

## 2023-03-19 ENCOUNTER — Encounter (HOSPITAL_COMMUNITY): Payer: Self-pay | Admitting: Emergency Medicine

## 2023-03-19 DIAGNOSIS — T23211A Burn of second degree of right thumb (nail), initial encounter: Secondary | ICD-10-CM | POA: Diagnosis not present

## 2023-03-19 DIAGNOSIS — T22211A Burn of second degree of right forearm, initial encounter: Secondary | ICD-10-CM

## 2023-03-19 MED ORDER — SILVER SULFADIAZINE 1 % EX CREA
TOPICAL_CREAM | Freq: Once | CUTANEOUS | Status: AC
Start: 2023-03-19 — End: 2023-03-19

## 2023-03-19 MED ORDER — SILVER SULFADIAZINE 1 % EX CREA
TOPICAL_CREAM | CUTANEOUS | Status: AC
  Filled 2023-03-19: qty 85

## 2023-03-19 MED ORDER — SILVER SULFADIAZINE 1 % EX CREA: 1.0000 | TOPICAL_CREAM | Freq: Every day | CUTANEOUS | 1 refills | Status: AC
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
## 2023-03-20 ENCOUNTER — Telehealth: Payer: 59 | Admitting: Family

## 2023-03-20 DIAGNOSIS — T22211A Burn of second degree of right forearm, initial encounter: Secondary | ICD-10-CM
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
## 2023-10-11 DIAGNOSIS — Z1329 Encounter for screening for other suspected endocrine disorder: Secondary | ICD-10-CM | POA: Diagnosis not present

## 2023-10-11 DIAGNOSIS — F411 Generalized anxiety disorder: Secondary | ICD-10-CM | POA: Diagnosis not present

## 2023-10-11 DIAGNOSIS — Z716 Tobacco abuse counseling: Secondary | ICD-10-CM | POA: Diagnosis not present

## 2023-10-11 DIAGNOSIS — Z79899 Other long term (current) drug therapy: Secondary | ICD-10-CM | POA: Diagnosis not present

## 2023-10-11 DIAGNOSIS — Z13220 Encounter for screening for lipoid disorders: Secondary | ICD-10-CM | POA: Diagnosis not present

## 2023-10-11 DIAGNOSIS — Z131 Encounter for screening for diabetes mellitus: Secondary | ICD-10-CM | POA: Diagnosis not present

## 2023-10-11 DIAGNOSIS — Z87891 Personal history of nicotine dependence: Secondary | ICD-10-CM | POA: Diagnosis not present

## 2023-10-11 DIAGNOSIS — Z0000 Encounter for general adult medical examination without abnormal findings: Secondary | ICD-10-CM | POA: Diagnosis not present

## 2023-11-01 DIAGNOSIS — F411 Generalized anxiety disorder: Secondary | ICD-10-CM | POA: Diagnosis not present

## 2023-11-01 DIAGNOSIS — F338 Other recurrent depressive disorders: Secondary | ICD-10-CM | POA: Diagnosis not present

## 2023-11-01 DIAGNOSIS — F439 Reaction to severe stress, unspecified: Secondary | ICD-10-CM | POA: Diagnosis not present

## 2023-11-25 DIAGNOSIS — F411 Generalized anxiety disorder: Secondary | ICD-10-CM | POA: Diagnosis not present

## 2023-11-25 DIAGNOSIS — Z01419 Encounter for gynecological examination (general) (routine) without abnormal findings: Secondary | ICD-10-CM | POA: Diagnosis not present

## 2024-01-24 ENCOUNTER — Ambulatory Visit: Admission: EM | Admit: 2024-01-24 | Discharge: 2024-01-24 | Disposition: A | Discharge status: Home / Self Care

## 2024-01-24 ENCOUNTER — Encounter: Payer: Self-pay | Admitting: Emergency Medicine

## 2024-01-24 DIAGNOSIS — J029 Acute pharyngitis, unspecified: Secondary | ICD-10-CM | POA: Insufficient documentation

## 2024-01-24 LAB — POCT RAPID STREP A (OFFICE): Rapid Strep A Screen: NEGATIVE

## 2024-01-24 MED ORDER — IBUPROFEN 600 MG PO TABS: 600.0000 mg | ORAL_TABLET | Freq: Four times a day (QID) | ORAL | 0 refills | Status: AC | PRN

## 2024-01-24 MED ORDER — AMOXICILLIN 500 MG PO CAPS: 500.0000 mg | ORAL_CAPSULE | Freq: Two times a day (BID) | ORAL | 0 refills | Status: AC

## 2024-01-24 NOTE — ED Provider Notes (Signed)
 [facility]  Note:  This document was prepared using Dragon voice recognition software and may include unintentional dictation errors.  MRN: [id number] DOB: 02/16/1986  Subjective:   Julia Torres is a 38 y.o. female presenting for sore throat, swelling to lymph nodes, fever since this morning around 4 AM.  Patient denies any known sick contacts.  Has not taken any over-the-counter medication to treat symptoms.  No cough, nasal congestion, body aches, fatigue  No current facility-administered medications for this encounter.  Current Outpatient Medications:    amoxicillin  (AMOXIL ) 500 MG capsule, Take 1 capsule (500 mg total) by mouth 2 (two) times daily for 10 days., Disp: 20 capsule, Rfl: 0   ibuprofen  (ADVIL ) 600 MG tablet, Take 1 tablet (600 mg total) by mouth every 6 (six) hours as needed., Disp: 30 tablet, Rfl: 0   albuterol  (VENTOLIN  HFA) 108 (90 Base) MCG/ACT inhaler, Inhale 2 puffs into the lungs every 4 (four) hours as needed for wheezing or shortness of breath (Cough)., Disp: 18 g, Rfl: 1   fluticasone  (FLONASE ) 50 MCG/ACT nasal spray, Place 1 spray into both nostrils daily., Disp: 48 mL, Rfl: 1   fluticasone -salmeterol (ADVAIR DISKUS) 100-50 MCG/ACT AEPB, Inhale 1 puff into the lungs 2 (two) times daily. (Patient not taking: Reported on 03/19/2023), Disp: 1 each, Rfl: 1   ipratropium (ATROVENT ) 0.06 % nasal spray, Place 2 sprays into both nostrils 3 (three) times daily. As needed for nasal congestion, runny nose (Patient not taking: Reported on 03/19/2023), Disp: 15 mL, Rfl: 1   montelukast  (SINGULAIR ) 10 MG tablet, Take 1 tablet (10 mg total) by mouth at bedtime., Disp: 90 tablet, Rfl: 1   naproxen  (NAPROSYN ) 500 MG tablet, Take 1 tablet (500 mg total) by mouth 2 (two) times daily with a meal. (Patient not taking: Reported on 03/19/2023), Disp: 30 tablet, Rfl: 0   predniSONE  (DELTASONE ) 20 MG tablet, Take 2 tablets daily with breakfast. (Patient not taking:  Reported on 03/19/2023), Disp: 10 tablet, Rfl: 0   promethazine -dextromethorphan (PROMETHAZINE -DM) 6.25-15 MG/5ML syrup, Take 5 mLs by mouth 4 (four) times daily as needed for cough. (Patient not taking: Reported on 01/24/2024), Disp: 200 mL, Rfl: 0   silver  sulfADIAZINE  (SILVADENE ) 1 % cream, Apply 1 Application topically daily., Disp: 85 g, Rfl: 1   tobramycin  (TOBREX ) 0.3 % ophthalmic solution, Place 1 drop into both eyes every 4 (four) hours. (Patient not taking: Reported on 03/19/2023), Disp: 5 mL, Rfl: 0   No Known Allergies  Past Medical History:  Diagnosis Date   Anxiety    Depression    Pregnancy induced hypertension      Past Surgical History:  Procedure Laterality Date   NO PAST SURGERIES      Family History  Problem Relation Age of Onset   HIV Mother     Social History   Tobacco Use   Smoking status: Every Day    Current packs/day: 0.25    Types: Cigarettes   Smokeless tobacco: Never   Tobacco comments:    4-5 cigarettes in  day  Vaping Use   Vaping status: Former  Substance Use Topics   Alcohol use: Yes    Comment: occ   Drug use: Not Currently    ROS Refer to HPI for ROS details.  Objective:   Vitals: There were no vitals taken for this visit.  Physical Exam Vitals and nursing note reviewed.  Constitutional:      General: She is not in acute distress.  Appearance: Normal appearance. She is well-developed. She is not ill-appearing or toxic-appearing.  HENT:     Head: Normocephalic and atraumatic.     Nose: Nose normal. No congestion or rhinorrhea.     Mouth/Throat:     Mouth: Mucous membranes are moist.     Pharynx: Posterior oropharyngeal erythema present. No oropharyngeal exudate.  Eyes:     General:        Right eye: No discharge.        Left eye: No discharge.     Extraocular Movements: Extraocular movements intact.     Conjunctiva/sclera: Conjunctivae normal.  Cardiovascular:     Rate and Rhythm: Normal rate.  Pulmonary:      Effort: Pulmonary effort is normal. No respiratory distress.  Musculoskeletal:     Cervical back: Normal range of motion. No rigidity or tenderness.  Lymphadenopathy:     Cervical: Cervical adenopathy present.  Skin:    General: Skin is warm and dry.  Neurological:     General: No focal deficit present.     Mental Status: She is alert and oriented to person, place, and time.  Psychiatric:        Mood and Affect: Mood normal.        Behavior: Behavior normal.     Procedures  Results for orders placed or performed during the hospital encounter of 01/24/24 (from the past 24 hours)  POCT rapid strep A     Status: None   Collection Time: 01/24/24  8:04 PM  Result Value Ref Range   Rapid Strep A Screen Negative Negative    No results found.   Assessment and Plan :     Discharge Instructions       1. Acute pharyngitis, unspecified etiology (Primary) - POCT rapid strep A complete and UC is negative for strep pharyngitis - Culture, group A strep collected in UC and sent to lab for final testing results should be available in 2 to 3 days. - ibuprofen  (ADVIL ) 600 MG tablet; Take 1 tablet (600 mg total) by mouth every 6 (six) hours as needed.  Dispense: 30 tablet; Refill: 0 - amoxicillin  (AMOXIL ) 500 MG capsule; Take 1 capsule (500 mg total) by mouth 2 (two) times daily for 10 days.  Dispense: 20 capsule; Refill: 0 -Continue to monitor symptoms for any change in severity if there is any escalation of current symptoms or development of new symptoms follow-up in ER for further evaluation and management.      Stefon Ramthun B Hunter Bachar   Konstantine Gervasi, [place], [place] 01/24/24 2007

## 2024-01-24 NOTE — Discharge Instructions (Signed)
  1. Acute pharyngitis, unspecified etiology (Primary) - POCT rapid strep A complete and UC is negative for strep pharyngitis - Culture, group A strep collected in UC and sent to lab for final testing results should be available in 2 to 3 days. - ibuprofen  (ADVIL ) 600 MG tablet; Take 1 tablet (600 mg total) by mouth every 6 (six) hours as needed.  Dispense: 30 tablet; Refill: 0 - amoxicillin  (AMOXIL ) 500 MG capsule; Take 1 capsule (500 mg total) by mouth 2 (two) times daily for 10 days.  Dispense: 20 capsule; Refill: 0 -Continue to monitor symptoms for any change in severity if there is any escalation of current symptoms or development of new symptoms follow-up in ER for further evaluation and management.

## 2024-01-24 NOTE — ED Triage Notes (Signed)
 Pt c/o sore throat and lymph node swelling that began this morning.

## 2024-01-27 ENCOUNTER — Ambulatory Visit (HOSPITAL_COMMUNITY): Payer: Self-pay

## 2024-01-27 LAB — CULTURE, GROUP A STREP (THRC)

## 2024-02-20 ENCOUNTER — Ambulatory Visit
Admission: EM | Admit: 2024-02-20 | Discharge: 2024-02-20 | Disposition: A | Discharge status: Home / Self Care | Attending: Nurse Practitioner | Admitting: Nurse Practitioner

## 2024-02-20 ENCOUNTER — Encounter: Payer: Self-pay | Admitting: Emergency Medicine

## 2024-02-20 DIAGNOSIS — R1012 Left upper quadrant pain: Secondary | ICD-10-CM | POA: Diagnosis not present

## 2024-02-20 LAB — POCT URINE PREGNANCY: Preg Test, Ur: NEGATIVE

## 2024-02-20 LAB — POCT URINE DIPSTICK
Bilirubin, UA: NEGATIVE
Glucose, UA: NEGATIVE mg/dL
Ketones, POC UA: NEGATIVE mg/dL
Leukocytes, UA: NEGATIVE
Nitrite, UA: NEGATIVE
POC PROTEIN,UA: NEGATIVE
Spec Grav, UA: 1.02 (ref 1.010–1.025)
Urobilinogen, UA: NEGATIVE U/dL — AB
pH, UA: 7 (ref 5.0–8.0)

## 2024-02-20 MED ORDER — DICYCLOMINE HCL 20 MG PO TABS: 20.0000 mg | ORAL_TABLET | Freq: Four times a day (QID) | ORAL | 0 refills | Status: AC | PRN

## 2024-02-20 MED ORDER — OMEPRAZOLE 20 MG PO CPDR: 20.0000 mg | DELAYED_RELEASE_CAPSULE | Freq: Every day | ORAL | 0 refills | Status: AC

## 2024-02-20 NOTE — Discharge Instructions (Addendum)
 You were seen today for abdominal pain located in the upper left side of your abdomen. Your symptoms and exam are reassuring and do not show any signs of a serious or surgical problem. The pain is likely related to stomach or intestinal irritation, gas buildup, or muscle cramping. Your urine test and pregnancy test were normal, and additional lab work has been ordered to check your blood counts, electrolytes, and pancreas function. You will be contacted if any of these results are abnormal. To help your symptoms, you were prescribed omeprazole to take once daily to reduce stomach irritation and Bentyl to use as needed for cramping or discomfort. Drink plenty of fluids and try to stay hydrated. Eat small, frequent meals and follow a bland diet while your stomach settles. Good options include bananas, rice, applesauce, toast, oatmeal, and plain crackers. Avoid fried, greasy, or spicy foods, dairy products, and red-colored foods or drinks for now, as these can worsen irritation. Gentle activity and walking after meals may also help relieve gas and bloating. Follow up with your primary care provider if your pain does not improve within a few days or if it continues after your test results return. Go to the emergency department right away if you develop severe or worsening abdominal pain, vomiting, black or bloody stools, swelling of your abdomen, fever, or are unable to keep fluids down or urinate.

## 2024-02-20 NOTE — ED Provider Notes (Signed)
 [facility]    CSN: [id number] Arrival date & time: 02/20/24  0920      History   Chief Complaint Chief Complaint  Patient presents with   Abdominal Pain    HPI Julia Torres is a 38 y.o. female.   Discussed the use of AI scribe software for clinical note transcription with the patient, who gave verbal consent to proceed.   The patient presents with left upper quadrant abdominal pain that has been intermittent for the past three days. The pain began Friday evening and is described as stabbing and cramping, located in the left upper quadrant and radiating into the epigastric region. The episodes occur unpredictably, reaching a severity of 7.5 out of 10 at their worst. The pain improves with passage of gas and is associated with increased burping and flatulence. She also experiences discomfort when taking a deep breath, though the pain does not interfere with sleep.  She reports variable bowel habits over the past few days, alternating between constipation, soft stools, and occasional watery stools depending on her food intake. She denies blood in stool, persistent diarrhea, nausea, vomiting, or loss of appetite and reports eating and drinking normally. Her diet primarily consists of home-cooked meals and light snacking throughout the day, with minimal fast food or restaurant dining.  She denies urinary symptoms, including pain, burning, or blood in the urine. Last menstrual period was at the end of September into the beginning of October. She is not on birth control, is sexually active with one female partner in the past three months, and reports intermittent condom use. She denies vaginal discharge, odor, itching, or irritation.  Review of systems is negative for fever, chills, body aches, chest pain, shortness of breath, palpitations, dizziness, or headache. She denies recent illness, cough, congestion, or sick contacts. She was last treated with antibiotics for a cold on  September 23rd. The patient has no history of abdominal surgeries.  The following sections of the patient's history were reviewed and updated as appropriate: allergies, current medications, past family history, past medical history, past social history, past surgical history, and problem list.       Past Medical History:  Diagnosis Date   Anxiety    Depression    Pregnancy induced hypertension     Patient Active Problem List   Diagnosis Date Noted   SVD (spontaneous vaginal delivery) 12/10/2018   Gestational hypertension, third trimester 12/09/2018   Gestational hypertension 12/07/2018   Supervision of other normal pregnancy, antepartum 06/19/2018   Hx of preeclampsia, prior pregnancy, currently pregnant 06/19/2018    Past Surgical History:  Procedure Laterality Date   NO PAST SURGERIES      OB History     Gravida  3   Para  2   Term  2   Preterm      AB  1   Living  2      SAB      IAB  1   Ectopic      Multiple  0   Live Births  2            Home Medications    Prior to Admission medications   Medication Sig Start Date End Date Taking? Authorizing Provider  dicyclomine (BENTYL) 20 MG tablet Take 1 tablet (20 mg total) by mouth 4 (four) times daily as needed (abdominal pain/cramping). 02/20/24  Yes Kirubel Aja, FNP  omeprazole (PRILOSEC) 20 MG capsule Take 1 capsule (20 mg total) by mouth daily. 02/20/24  Yes Iola Lukes, FNP  albuterol  (VENTOLIN  HFA) 108 (90 Base) MCG/ACT inhaler Inhale 2 puffs into the lungs every 4 (four) hours as needed for wheezing or shortness of breath (Cough). 02/09/22   Van Knee, MD  fluticasone  (FLONASE ) 50 MCG/ACT nasal spray Place 1 spray into both nostrils daily. 08/28/21   Joesph Shaver Scales, PA-C  ibuprofen  (ADVIL ) 600 MG tablet Take 1 tablet (600 mg total) by mouth every 6 (six) hours as needed. 01/24/24   Reddick, Johnathan B, NP  montelukast  (SINGULAIR ) 10 MG tablet Take 1 tablet (10 mg  total) by mouth at bedtime. 08/28/21 02/24/22  Joesph Shaver Scales, PA-C  silver  sulfADIAZINE  (SILVADENE ) 1 % cream Apply 1 Application topically daily. 03/19/23   Teresa Almarie LABOR, PA-C    Family History Family History  Problem Relation Age of Onset   HIV Mother     Social History Social History   Tobacco Use   Smoking status: Every Day    Current packs/day: 0.25    Types: Cigarettes   Smokeless tobacco: Never   Tobacco comments:    4-5 cigarettes in  day  Vaping Use   Vaping status: Former  Substance Use Topics   Alcohol use: Yes    Comment: occ   Drug use: Not Currently     Allergies   Patient has no known allergies.   Review of Systems Review of Systems  Constitutional:  Negative for appetite change, chills and fever.  Respiratory:  Negative for shortness of breath.   Cardiovascular:  Negative for chest pain, palpitations and leg swelling.  Gastrointestinal:  Positive for abdominal pain, constipation (some) and diarrhea (some). Negative for anal bleeding, blood in stool, nausea and vomiting.       Alternates between constipation, watery stools and normal soft stools   Genitourinary:  Negative for dysuria, hematuria, menstrual problem (LMP: around the end of Sept) and vaginal discharge.       No vaginal itching, irritation or odor   Musculoskeletal:  Negative for myalgias.  Neurological:  Negative for dizziness and headaches.  Psychiatric/Behavioral:  Negative for sleep disturbance.   All other systems reviewed and are negative.    Physical Exam Triage Vital Signs ED Triage Vitals  Encounter Vitals Group     BP 02/20/24 0934 130/87     Girls Systolic BP Percentile --      Girls Diastolic BP Percentile --      Boys Systolic BP Percentile --      Boys Diastolic BP Percentile --      Pulse Rate 02/20/24 0934 75     Resp 02/20/24 0934 17     Temp 02/20/24 0934 98.7 F (37.1 C)     Temp Source 02/20/24 0934 Oral     SpO2 02/20/24 0934 99 %     Weight  --      Height --      Head Circumference --      Peak Flow --      Pain Score 02/20/24 0939 8     Pain Loc --      Pain Education --      Exclude from Growth Chart --    No data found.  Updated Vital Signs BP 130/87 (BP Location: Right Arm)   Pulse 75   Temp 98.7 F (37.1 C) (Oral)   Resp 17   LMP 01/31/2024 (Approximate)   SpO2 99%   Breastfeeding No   Visual Acuity Right Eye Distance:   Left Eye Distance:  Bilateral Distance:    Right Eye Near:   Left Eye Near:    Bilateral Near:     Physical Exam Vitals reviewed.  Constitutional:      General: She is awake. She is not in acute distress.    Appearance: Normal appearance. She is well-developed. She is not ill-appearing, toxic-appearing or diaphoretic.  HENT:     Head: Normocephalic.     Right Ear: Hearing normal.     Left Ear: Hearing normal.     Nose: Nose normal.     Mouth/Throat:     Mouth: Mucous membranes are moist.  Eyes:     General: Vision grossly intact.     Conjunctiva/sclera: Conjunctivae normal.  Cardiovascular:     Rate and Rhythm: Normal rate and regular rhythm.     Heart sounds: Normal heart sounds.  Pulmonary:     Effort: Pulmonary effort is normal.     Breath sounds: Normal breath sounds and air entry.  Abdominal:     General: Bowel sounds are normal. There is no distension.     Palpations: Abdomen is soft.     Tenderness: There is no abdominal tenderness. There is no right CVA tenderness, left CVA tenderness, guarding or rebound.  Musculoskeletal:        General: Normal range of motion.     Cervical back: Normal range of motion and neck supple.  Skin:    General: Skin is warm and dry.  Neurological:     General: No focal deficit present.     Mental Status: She is alert and oriented to person, place, and time.  Psychiatric:        Speech: Speech normal.        Behavior: Behavior is cooperative.      UC Treatments / Results  Labs (all labs ordered are listed, but only abnormal  results are displayed) Labs Reviewed  POCT URINE DIPSTICK - Abnormal; Notable for the following components:      Result Value   Blood, UA trace-intact (*)    Urobilinogen, UA negative (*)    All other components within normal limits  CBC WITH DIFFERENTIAL/PLATELET  LIPASE  COMPREHENSIVE METABOLIC PANEL WITH GFR  POCT URINE PREGNANCY    EKG   Radiology No results found.  Procedures Procedures (including critical care time)  Medications Ordered in UC Medications - No data to display  Initial Impression / Assessment and Plan / UC Course  I have reviewed the triage vital signs and the nursing notes.  Pertinent labs & imaging results that were available during my care of the patient were reviewed by me and considered in my medical decision making (see chart for details).     Patient presents with a 3-day history of intermittent left upper quadrant and epigastric pain described as stabbing and cramping, with associated gas and bloating. Pain improves with passage of gas. Bowel habits have been variable, alternating between constipation, soft stools, and occasional loose stools, without blood. No fever, chills, nausea, vomiting, or systemic symptoms. Physical exam reveals no abdominal tenderness. Urine pregnancy test is negative, and urinalysis is normal. Findings are consistent with a benign gastrointestinal process, likely related to functional dyspepsia or mild irritable bowel activity, with low concern for obstruction or other acute pathology. CBC, CMP, and lipase ordered for further evaluation. Patient advised to maintain hydration, follow a bland diet, and avoid dairy, fried, greasy, spicy, or red-colored foods and drinks. Bentyl prescribed as needed for abdominal cramping and omeprazole initiated daily.  If blood work is normal and pain persists, patient to follow up with primary care. Advised to seek emergency care for worsening pain, vomiting, black or bloody stools, abdominal  swelling, inability to urinate, or any new concerning symptoms.  Today's evaluation has revealed no signs of a dangerous process. Discussed diagnosis with patient and/or guardian. Patient and/or guardian aware of their diagnosis, possible red flag symptoms to watch out for and need for close follow up. Patient and/or guardian understands verbal and written discharge instructions. Patient and/or guardian comfortable with plan and disposition.  Patient and/or guardian has a clear mental status at this time, good insight into illness (after discussion and teaching) and has clear judgment to make decisions regarding their care  Documentation was completed with the aid of voice recognition software. Transcription may contain typographical errors.  Final Clinical Impressions(s) / UC Diagnoses   Final diagnoses:  Left upper quadrant abdominal pain     Discharge Instructions      You were seen today for abdominal pain located in the upper left side of your abdomen. Your symptoms and exam are reassuring and do not show any signs of a serious or surgical problem. The pain is likely related to stomach or intestinal irritation, gas buildup, or muscle cramping. Your urine test and pregnancy test were normal, and additional lab work has been ordered to check your blood counts, electrolytes, and pancreas function. You will be contacted if any of these results are abnormal. To help your symptoms, you were prescribed omeprazole to take once daily to reduce stomach irritation and Bentyl to use as needed for cramping or discomfort. Drink plenty of fluids and try to stay hydrated. Eat small, frequent meals and follow a bland diet while your stomach settles. Good options include bananas, rice, applesauce, toast, oatmeal, and plain crackers. Avoid fried, greasy, or spicy foods, dairy products, and red-colored foods or drinks for now, as these can worsen irritation. Gentle activity and walking after meals may also help  relieve gas and bloating. Follow up with your primary care provider if your pain does not improve within a few days or if it continues after your test results return. Go to the emergency department right away if you develop severe or worsening abdominal pain, vomiting, black or bloody stools, swelling of your abdomen, fever, or are unable to keep fluids down or urinate.      ED Prescriptions     Medication Sig Dispense Auth. Provider   dicyclomine (BENTYL) 20 MG tablet Take 1 tablet (20 mg total) by mouth 4 (four) times daily as needed (abdominal pain/cramping). 20 tablet Iola Lukes, FNP   omeprazole (PRILOSEC) 20 MG capsule Take 1 capsule (20 mg total) by mouth daily. 30 capsule Iola Lukes, FNP      PDMP not reviewed this encounter.   Iola Lukes, [place] 02/20/24 [phone]

## 2024-02-20 NOTE — ED Triage Notes (Signed)
 Pt c/o left upper quadrant abdominal pain that comes and goes for 3 days. Describes pain as stabbing cramp  Denies nausea or vomiting.   Last BM this morning.

## 2024-02-21 ENCOUNTER — Ambulatory Visit (HOSPITAL_COMMUNITY): Payer: Self-pay

## 2024-02-21 LAB — CBC WITH DIFFERENTIAL/PLATELET
Basophils Absolute: 0.1 x10E3/uL (ref 0.0–0.2)
Basos: 1 %
EOS (ABSOLUTE): 0.5 x10E3/uL — ABNORMAL HIGH (ref 0.0–0.4)
Eos: 5 %
Hematocrit: 43.8 % (ref 34.0–46.6)
Hemoglobin: 14 g/dL (ref 11.1–15.9)
Immature Grans (Abs): 0 x10E3/uL (ref 0.0–0.1)
Immature Granulocytes: 0 %
Lymphocytes Absolute: 2.4 x10E3/uL (ref 0.7–3.1)
Lymphs: 21 %
MCH: 29.5 pg (ref 26.6–33.0)
MCHC: 32 g/dL (ref 31.5–35.7)
MCV: 92 fL (ref 79–97)
Monocytes Absolute: 0.7 x10E3/uL (ref 0.1–0.9)
Monocytes: 6 %
Neutrophils Absolute: 7.7 x10E3/uL — ABNORMAL HIGH (ref 1.4–7.0)
Neutrophils: 67 %
Platelets: 279 x10E3/uL (ref 150–450)
RBC: 4.74 x10E6/uL (ref 3.77–5.28)
RDW: 14 % (ref 11.7–15.4)
WBC: 11.3 x10E3/uL — ABNORMAL HIGH (ref 3.4–10.8)

## 2024-02-21 LAB — COMPREHENSIVE METABOLIC PANEL WITH GFR
ALT: 13 IU/L (ref 0–32)
AST: 15 IU/L (ref 0–40)
Albumin: 4.2 g/dL (ref 3.9–4.9)
Alkaline Phosphatase: 73 IU/L (ref 41–116)
BUN/Creatinine Ratio: 14 (ref 9–23)
BUN: 9 mg/dL (ref 6–20)
Bilirubin Total: 0.4 mg/dL (ref 0.0–1.2)
CO2: 23 mmol/L (ref 20–29)
Calcium: 9.2 mg/dL (ref 8.7–10.2)
Chloride: 104 mmol/L (ref 96–106)
Creatinine, Ser: 0.65 mg/dL (ref 0.57–1.00)
Globulin, Total: 2.4 g/dL (ref 1.5–4.5)
Glucose: 98 mg/dL (ref 70–99)
Potassium: 4.2 mmol/L (ref 3.5–5.2)
Sodium: 138 mmol/L (ref 134–144)
Total Protein: 6.6 g/dL (ref 6.0–8.5)
eGFR: 117 mL/min/1.73 (ref >59)

## 2024-02-21 LAB — LIPASE: Lipase: 26 U/L (ref 14–72)
